# Patient Record
Sex: Female | Born: 1935 | Race: Black or African American | Hispanic: No | Marital: Single | Smoking: Never smoker
Health system: Southern US, Community
[De-identification: ages and names within clinical notes are randomized; demographics above are authoritative.]

## PROBLEM LIST (undated history)

## (undated) DIAGNOSIS — I10 Essential (primary) hypertension: Secondary | ICD-10-CM

## (undated) DIAGNOSIS — J45909 Unspecified asthma, uncomplicated: Secondary | ICD-10-CM

## (undated) DIAGNOSIS — I639 Cerebral infarction, unspecified: Secondary | ICD-10-CM

## (undated) DIAGNOSIS — I2510 Atherosclerotic heart disease of native coronary artery without angina pectoris: Secondary | ICD-10-CM

## (undated) DIAGNOSIS — E119 Type 2 diabetes mellitus without complications: Secondary | ICD-10-CM

## (undated) HISTORY — PX: BREAST SURGERY: SHX581

---

## 2013-03-09 DIAGNOSIS — I639 Cerebral infarction, unspecified: Secondary | ICD-10-CM

## 2013-03-09 HISTORY — DX: Cerebral infarction, unspecified: I63.9

## 2013-05-16 ENCOUNTER — Encounter (HOSPITAL_COMMUNITY): Payer: Self-pay | Admitting: Emergency Medicine

## 2013-05-16 ENCOUNTER — Emergency Department (HOSPITAL_COMMUNITY)
Admission: EM | Admit: 2013-05-16 | Discharge: 2013-05-17 | Disposition: A | Discharge status: Home / Self Care | Payer: Medicare (Managed Care) | Attending: Emergency Medicine | Admitting: Emergency Medicine

## 2013-05-16 DIAGNOSIS — Z79899 Other long term (current) drug therapy: Secondary | ICD-10-CM | POA: Insufficient documentation

## 2013-05-16 DIAGNOSIS — E119 Type 2 diabetes mellitus without complications: Secondary | ICD-10-CM | POA: Insufficient documentation

## 2013-05-16 DIAGNOSIS — Z791 Long term (current) use of non-steroidal anti-inflammatories (NSAID): Secondary | ICD-10-CM | POA: Insufficient documentation

## 2013-05-16 DIAGNOSIS — J45909 Unspecified asthma, uncomplicated: Secondary | ICD-10-CM | POA: Insufficient documentation

## 2013-05-16 DIAGNOSIS — I2510 Atherosclerotic heart disease of native coronary artery without angina pectoris: Secondary | ICD-10-CM | POA: Insufficient documentation

## 2013-05-16 DIAGNOSIS — Z8673 Personal history of transient ischemic attack (TIA), and cerebral infarction without residual deficits: Secondary | ICD-10-CM | POA: Insufficient documentation

## 2013-05-16 DIAGNOSIS — I10 Essential (primary) hypertension: Secondary | ICD-10-CM | POA: Insufficient documentation

## 2013-05-16 DIAGNOSIS — R319 Hematuria, unspecified: Secondary | ICD-10-CM

## 2013-05-16 DIAGNOSIS — Z794 Long term (current) use of insulin: Secondary | ICD-10-CM | POA: Insufficient documentation

## 2013-05-16 DIAGNOSIS — N390 Urinary tract infection, site not specified: Secondary | ICD-10-CM | POA: Insufficient documentation

## 2013-05-16 DIAGNOSIS — Z7902 Long term (current) use of antithrombotics/antiplatelets: Secondary | ICD-10-CM | POA: Insufficient documentation

## 2013-05-16 DIAGNOSIS — Z7982 Long term (current) use of aspirin: Secondary | ICD-10-CM | POA: Insufficient documentation

## 2013-05-16 HISTORY — DX: Essential (primary) hypertension: I10

## 2013-05-16 HISTORY — DX: Type 2 diabetes mellitus without complications: E11.9

## 2013-05-16 HISTORY — DX: Atherosclerotic heart disease of native coronary artery without angina pectoris: I25.10

## 2013-05-16 HISTORY — DX: Cerebral infarction, unspecified: I63.9

## 2013-05-16 HISTORY — DX: Unspecified asthma, uncomplicated: J45.909

## 2013-05-16 LAB — COMPREHENSIVE METABOLIC PANEL WITH GFR
ALT: 12 U/L (ref 0–35)
AST: 13 U/L (ref 0–37)
Albumin: 3.5 g/dL (ref 3.5–5.2)
Alkaline Phosphatase: 78 U/L (ref 39–117)
BUN: 18 mg/dL (ref 6–23)
CO2: 31 meq/L (ref 19–32)
Calcium: 9.4 mg/dL (ref 8.4–10.5)
Chloride: 103 meq/L (ref 96–112)
Creatinine, Ser: 0.85 mg/dL (ref 0.50–1.10)
GFR calc Af Amer: 74 mL/min — ABNORMAL LOW
GFR calc non Af Amer: 64 mL/min — ABNORMAL LOW
Glucose, Bld: 224 mg/dL — ABNORMAL HIGH (ref 70–99)
Potassium: 4.3 meq/L (ref 3.5–5.1)
Sodium: 140 meq/L (ref 135–145)
Total Bilirubin: 0.2 mg/dL — ABNORMAL LOW (ref 0.3–1.2)
Total Protein: 7.1 g/dL (ref 6.0–8.3)

## 2013-05-16 LAB — URINALYSIS W MICROSCOPIC + REFLEX CULTURE
Glucose, UA: 100 mg/dL — AB
Ketones, ur: 15 mg/dL — AB
Nitrite: POSITIVE — AB
Protein, ur: 100 mg/dL — AB
Specific Gravity, Urine: 1.02 (ref 1.005–1.030)
Urobilinogen, UA: 2 mg/dL — ABNORMAL HIGH (ref 0.0–1.0)
pH: 5.5 (ref 5.0–8.0)

## 2013-05-16 LAB — CBC WITH DIFFERENTIAL/PLATELET
Basophils Relative: 0 % (ref 0–1)
Eosinophils Absolute: 0.1 10*3/uL (ref 0.0–0.7)
HCT: 41 % (ref 36.0–46.0)
Hemoglobin: 13.7 g/dL (ref 12.0–15.0)
Lymphocytes Relative: 31 % (ref 12–46)
Lymphs Abs: 2.8 10*3/uL (ref 0.7–4.0)
MCH: 29.7 pg (ref 26.0–34.0)
MCHC: 33.4 g/dL (ref 30.0–36.0)
MCV: 88.7 fL (ref 78.0–100.0)
Monocytes Absolute: 0.6 10*3/uL (ref 0.1–1.0)
Monocytes Relative: 7 % (ref 3–12)
Neutrophils Relative %: 61 % (ref 43–77)
RBC: 4.62 MIL/uL (ref 3.87–5.11)
WBC: 9 10*3/uL (ref 4.0–10.5)

## 2013-05-16 LAB — PROTIME-INR
INR: 1.03 (ref 0.00–1.49)
Prothrombin Time: 13.3 seconds (ref 11.6–15.2)

## 2013-05-16 NOTE — ED Notes (Signed)
Pt states she had noticed small blood clots in urine and urine has blood tinge look. Pt denies any pain at this time.

## 2013-05-16 NOTE — ED Notes (Signed)
Md at bedside

## 2013-05-16 NOTE — ED Notes (Signed)
Pt is here with blood in her urine for last couple of days and now is constant.   Pt denies pain to abdomen but reports lower abdomen feels tight.  Pt is on plavix

## 2013-05-16 NOTE — ED Provider Notes (Signed)
CSN: [id number]     Arrival date & time 05/16/13  1842 History   First MD Initiated Contact with Patient 05/16/13 2047     Chief Complaint  Patient presents with  . Hematuria   (Consider location/radiation/quality/duration/timing/severity/associated sxs/prior Treatment) Patient is a 79 y.o. female presenting with hematuria. The history is provided by the patient and a caregiver.  Hematuria This is a new problem. Episode onset: 2-3 weeks ago. The problem occurs daily. The problem has been gradually worsening. Pertinent negatives include no chest pain, no abdominal pain, no headaches and no shortness of breath. Nothing aggravates the symptoms. Nothing relieves the symptoms. She has tried nothing for the symptoms. The treatment provided no relief.    Past Medical History  Diagnosis Date  . Hypertension   . Diabetes mellitus without complication   . Coronary artery disease   . Stroke   . Asthma    History reviewed. No pertinent past surgical history. No family history on file. History  Substance Use Topics  . Smoking status: Never Smoker   . Smokeless tobacco: Not on file  . Alcohol Use: No   OB History   Grav Para Term Preterm Abortions TAB SAB Ect Mult Living                 Review of Systems  Constitutional: Negative for fever and fatigue.  HENT: Negative for congestion and drooling.   Eyes: Negative for pain.  Respiratory: Negative for cough and shortness of breath.   Cardiovascular: Negative for chest pain.  Gastrointestinal: Negative for nausea, vomiting, abdominal pain and diarrhea.  Genitourinary: Positive for hematuria. Negative for dysuria.  Musculoskeletal: Negative for back pain, gait problem and neck pain.  Skin: Negative for color change.  Neurological: Negative for dizziness and headaches.  Hematological: Negative for adenopathy.  Psychiatric/Behavioral: Negative for behavioral problems.  All other systems reviewed and are negative.    Allergies  Review  of patient's allergies indicates no known allergies.  Home Medications   Current Outpatient Rx  Name  Route  Sig  Dispense  Refill  . amLODipine (NORVASC) 10 MG tablet   Oral   Take 10 mg by mouth daily.         Marland Kitchen aspirin EC 81 MG tablet   Oral   Take 81 mg by mouth daily.         Marland Kitchen atenolol (TENORMIN) 50 MG tablet   Oral   Take 50 mg by mouth daily.         . bimatoprost (LUMIGAN) 0.01 % SOLN               . brimonidine (ALPHAGAN) 0.2 % ophthalmic solution               . carvedilol (COREG) 12.5 MG tablet   Oral   Take 12.5 mg by mouth 2 (two) times daily with a meal.         . clopidogrel (PLAVIX) 75 MG tablet   Oral   Take 75 mg by mouth daily with breakfast.         . dexlansoprazole (DEXILANT) 60 MG capsule   Oral   Take 60 mg by mouth daily.         . hydrochlorothiazide (MICROZIDE) 12.5 MG capsule   Oral   Take 12.5 mg by mouth daily.         . insulin aspart (NOVOLOG) 100 UNIT/ML injection   Subcutaneous   Inject into the skin 3 (three) times  daily before meals.         . insulin detemir (LEVEMIR) 100 UNIT/ML injection   Subcutaneous   Inject into the skin at bedtime.         . insulin lispro protamine-lispro (HUMALOG MIX 75/25) (75-25) 100 UNIT/ML SUSP injection   Subcutaneous   Inject into the skin.         Marland Kitchen irbesartan (AVAPRO) 300 MG tablet   Oral   Take 300 mg by mouth daily.         . isosorbide mononitrate (IMDUR) 30 MG 24 hr tablet   Oral   Take 30 mg by mouth daily.         . meloxicam (MOBIC) 15 MG tablet   Oral   Take 15 mg by mouth daily.         . rosuvastatin (CRESTOR) 40 MG tablet   Oral   Take 40 mg by mouth daily.          BP 155/87  Pulse 86  Temp(Src) 97.6 F (36.4 C) (Oral)  Resp 16  Wt 205 lb (92.987 kg)  SpO2 97% Physical Exam  Nursing note and vitals reviewed. Constitutional: She is oriented to person, place, and time. She appears well-developed and well-nourished.  HENT:   Head: Normocephalic.  Mouth/Throat: Oropharynx is clear and moist. No oropharyngeal exudate.  Eyes: Conjunctivae and EOM are normal. Pupils are equal, round, and reactive to light.  Neck: Normal range of motion. Neck supple.  Cardiovascular: Normal rate, regular rhythm, normal heart sounds and intact distal pulses.  Exam reveals no gallop and no friction rub.   No murmur heard. Pulmonary/Chest: Effort normal and breath sounds normal. No respiratory distress. She has no wheezes.  Abdominal: Soft. Bowel sounds are normal. There is no tenderness. There is no rebound and no guarding.  Genitourinary: Vagina normal.  Normal-appearing rectum. Brown stool.  Normal appearing external vagina. Normal appearing cervix. No blood noted in vaginal canal.   Musculoskeletal: Normal range of motion. She exhibits no edema and no tenderness.  Neurological: She is alert and oriented to person, place, and time.  Skin: Skin is warm and dry.  Psychiatric: She has a normal mood and affect. Her behavior is normal.    ED Course  Procedures (including critical care time) Labs Review Labs Reviewed  COMPREHENSIVE METABOLIC PANEL - Abnormal; Notable for the following:    Glucose, Bld 224 (*)    Total Bilirubin 0.2 (*)    GFR calc non Af Amer 64 (*)    GFR calc Af Amer 74 (*)    All other components within normal limits  URINALYSIS W MICROSCOPIC + REFLEX CULTURE - Abnormal; Notable for the following:    Color, Urine AMBER (*)    APPearance CLOUDY (*)    Glucose, UA 100 (*)    Hgb urine dipstick LARGE (*)    Bilirubin Urine MODERATE (*)    Ketones, ur 15 (*)    Protein, ur 100 (*)    Urobilinogen, UA 2.0 (*)    Nitrite POSITIVE (*)    Leukocytes, UA TRACE (*)    Bacteria, UA FEW (*)    All other components within normal limits  URINE CULTURE  CBC WITH DIFFERENTIAL  PROTIME-INR  OCCULT BLOOD, POC DEVICE   Imaging Review No results found.  EKG Interpretation   None       MDM   1. Hematuria    2. UTI (lower urinary tract infection)    9:16 PM  79 y.o. female with a history of MI and stroke on baby aspirin and Plavix who presents with mild blood streaking in her diaper for the last 2-3 weeks. The family states that the patient was recently discharged from a hospital in [place] after being hospitalized for approximately 30 days for heart attack and stroke. They report that upon discharge she was started on Plavix in addition to her baby aspirin. Since starting this medication they have seen some mild blood streaking in her depends. She denies any blood in her stools. She denies any pain as well. She is afebrile and vital signs are unremarkable here. Will get screening labwork, rectal exam, pelvic exam.   Normal pelvic. Hemeoccult neg. UA shows hematuria and evidence of a UTI. Will tx w/ vantin. I am hesitant to d/c her plavix at this time as I am not able to review her medical records from [place] and she has normal Hgb, no sx of anemia, no abnormal VS, or evidence of life threatening bleed. I will recommend that the family touch base w/ her physicians in [place] tomorrow to d/c possible d/c of this medicine. As the family is here visiting they requested Rx's for some of her medicines until they get back to [place].  I have discussed the diagnosis/risks/treatment options with the patient/family and believe the pt to be eligible for discharge home to follow-up with pcp for further evaluation once they return to [place]. We also discussed returning to the ED immediately if new or worsening sx occur. We discussed the sx which are most concerning (e.g., abd pain, fever, vomiting, worsening hematuria, sx of anemia) that necessitate immediate return. Any new prescriptions provided to the patient are listed below.  Discharge Medication List as of 05/17/2013 12:16 AM    START taking these medications   Details  !! amLODipine (NORVASC) 10 MG tablet Take 1 tablet (10 mg total) by mouth daily., Starting 05/17/2013, Until  Discontinued, Print    !! carvedilol (COREG) 12.5 MG tablet Take 1 tablet (12.5 mg total) by mouth 2 (two) times daily with a meal., Starting 05/17/2013, Until Discontinued, Print    cefpodoxime (VANTIN) 200 MG tablet Take 1 tablet (200 mg total) by mouth 2 (two) times daily., Starting 05/17/2013, Until Discontinued, Print    !! irbesartan (AVAPRO) 300 MG tablet Take 1 tablet (300 mg total) by mouth daily., Starting 05/17/2013, Until Discontinued, Print     !! - Potential duplicate medications found. Please discuss with provider.         Junius Argyle, MD 05/17/13 [phone]

## 2013-05-17 DIAGNOSIS — R319 Hematuria, unspecified: Secondary | ICD-10-CM | POA: Diagnosis present

## 2013-05-17 DIAGNOSIS — N390 Urinary tract infection, site not specified: Secondary | ICD-10-CM | POA: Diagnosis present

## 2013-05-17 MED ORDER — CARVEDILOL 12.5 MG PO TABS: 12.5000 mg | ORAL_TABLET | Freq: Two times a day (BID) | ORAL | Status: AC

## 2013-05-17 MED ORDER — AMLODIPINE BESYLATE 10 MG PO TABS: 10.0000 mg | ORAL_TABLET | Freq: Every day | ORAL | Status: AC

## 2013-05-17 MED ORDER — CEFPODOXIME PROXETIL 200 MG PO TABS: 200.0000 mg | ORAL_TABLET | Freq: Two times a day (BID) | ORAL | Status: DC

## 2013-05-17 MED ORDER — IRBESARTAN 300 MG PO TABS: 300.0000 mg | ORAL_TABLET | Freq: Every day | ORAL | Status: AC

## 2013-05-17 MED ORDER — CEFPODOXIME PROXETIL 200 MG PO TABS
200.0000 mg | ORAL_TABLET | Freq: Once | ORAL | Status: AC
  Administered 2013-05-17: 200 mg via ORAL
  Filled 2013-05-17: qty 1

## 2013-05-18 LAB — URINE CULTURE: Colony Count: >=100000

## 2013-06-05 ENCOUNTER — Encounter (HOSPITAL_COMMUNITY): Payer: Self-pay | Admitting: Emergency Medicine

## 2013-06-05 ENCOUNTER — Emergency Department (HOSPITAL_COMMUNITY)
Admission: EM | Admit: 2013-06-05 | Discharge: 2013-06-05 | Disposition: A | Discharge status: Home / Self Care | Payer: Medicare (Managed Care) | Attending: Emergency Medicine | Admitting: Emergency Medicine

## 2013-06-05 ENCOUNTER — Emergency Department (HOSPITAL_COMMUNITY): Payer: Medicare (Managed Care)

## 2013-06-05 DIAGNOSIS — J45909 Unspecified asthma, uncomplicated: Secondary | ICD-10-CM | POA: Insufficient documentation

## 2013-06-05 DIAGNOSIS — Z7902 Long term (current) use of antithrombotics/antiplatelets: Secondary | ICD-10-CM | POA: Insufficient documentation

## 2013-06-05 DIAGNOSIS — I2510 Atherosclerotic heart disease of native coronary artery without angina pectoris: Secondary | ICD-10-CM | POA: Insufficient documentation

## 2013-06-05 DIAGNOSIS — Z7982 Long term (current) use of aspirin: Secondary | ICD-10-CM | POA: Insufficient documentation

## 2013-06-05 DIAGNOSIS — Z794 Long term (current) use of insulin: Secondary | ICD-10-CM | POA: Insufficient documentation

## 2013-06-05 DIAGNOSIS — Z8673 Personal history of transient ischemic attack (TIA), and cerebral infarction without residual deficits: Secondary | ICD-10-CM | POA: Insufficient documentation

## 2013-06-05 DIAGNOSIS — R42 Dizziness and giddiness: Secondary | ICD-10-CM | POA: Insufficient documentation

## 2013-06-05 DIAGNOSIS — I10 Essential (primary) hypertension: Secondary | ICD-10-CM | POA: Insufficient documentation

## 2013-06-05 DIAGNOSIS — E119 Type 2 diabetes mellitus without complications: Secondary | ICD-10-CM | POA: Insufficient documentation

## 2013-06-05 DIAGNOSIS — Z79899 Other long term (current) drug therapy: Secondary | ICD-10-CM | POA: Insufficient documentation

## 2013-06-05 LAB — CBC WITH DIFFERENTIAL/PLATELET
Basophils Relative: 0 % (ref 0–1)
Eosinophils Absolute: 0.1 10*3/uL (ref 0.0–0.7)
Eosinophils Relative: 1 % (ref 0–5)
HCT: 41.8 % (ref 36.0–46.0)
Hemoglobin: 13.8 g/dL (ref 12.0–15.0)
MCH: 29.4 pg (ref 26.0–34.0)
MCHC: 33 g/dL (ref 30.0–36.0)
MCV: 88.9 fL (ref 78.0–100.0)
Monocytes Absolute: 0.6 10*3/uL (ref 0.1–1.0)
Monocytes Relative: 5 % (ref 3–12)
Neutro Abs: 10.2 10*3/uL — ABNORMAL HIGH (ref 1.7–7.7)
Neutrophils Relative %: 82 % — ABNORMAL HIGH (ref 43–77)
RBC: 4.7 MIL/uL (ref 3.87–5.11)

## 2013-06-05 LAB — BASIC METABOLIC PANEL
BUN: 17 mg/dL (ref 6–23)
CO2: 30 mEq/L (ref 19–32)
Chloride: 102 mEq/L (ref 96–112)
Creatinine, Ser: 0.71 mg/dL (ref 0.50–1.10)
GFR calc Af Amer: >90 mL/min (ref >90)
GFR calc non Af Amer: 80 mL/min — ABNORMAL LOW (ref >90)
Glucose, Bld: 151 mg/dL — ABNORMAL HIGH (ref 70–99)
Potassium: 3.9 mEq/L (ref 3.5–5.1)
Sodium: 139 mEq/L (ref 135–145)

## 2013-06-05 MED ORDER — MECLIZINE HCL 25 MG PO TABS
25.0000 mg | ORAL_TABLET | Freq: Once | ORAL | Status: AC
  Administered 2013-06-05: 25 mg via ORAL
  Filled 2013-06-05: qty 1

## 2013-06-05 MED ORDER — MECLIZINE HCL 25 MG PO TABS: 25.0000 mg | ORAL_TABLET | Freq: Three times a day (TID) | ORAL | Status: DC | PRN

## 2013-06-05 MED ORDER — ROSUVASTATIN CALCIUM 40 MG PO TABS: 40.0000 mg | ORAL_TABLET | Freq: Every day | ORAL | Status: AC

## 2013-06-05 NOTE — ED Notes (Signed)
Pt c/o dizziness upon waking this am with room spinning and worse with position change; pt c/o hypertension prior to arrival

## 2013-06-05 NOTE — ED Provider Notes (Signed)
CSN: [id number]     Arrival date & time 06/05/13  1120 History   First MD Initiated Contact with Patient 06/05/13 1139     Chief Complaint  Patient presents with  . Dizziness  . Hypertension   (Consider location/radiation/quality/duration/timing/severity/associated sxs/prior Treatment) HPI Comments: Patient is a 79 year old female with history of CAD and stroke. She presents today with complaints of dizziness that started this morning shortly after waking from sleep. She states she attempted to get out of bed and became very dizzy then fell back to the bed. She denies any syncopal episode or loss of consciousness. She describes her dizziness as a spinning sensation that is worse with turning head and change in position. She denies any chest pain or any palpitations. She denies any headache or weakness or numbness in her arms or legs. She tells me that she had a stroke in October that left her with mild right arm weakness.  Patient is a 79 y.o. female presenting with dizziness. The history is provided by the patient.  Dizziness Quality:  Head spinning and imbalance Severity:  Moderate Onset quality:  Sudden Duration:  5 hours Timing:  Constant Progression:  Unchanged Chronicity:  New Context: bending over and head movement   Context: not with loss of consciousness   Relieved by:  Nothing Worsened by:  Movement, turning head and standing up Ineffective treatments:  None tried Risk factors: hx of stroke     Past Medical History  Diagnosis Date  . Hypertension   . Diabetes mellitus without complication   . Coronary artery disease   . Stroke   . Asthma    History reviewed. No pertinent past surgical history. History reviewed. No pertinent family history. History  Substance Use Topics  . Smoking status: Never Smoker   . Smokeless tobacco: Not on file  . Alcohol Use: No   OB History   Grav Para Term Preterm Abortions TAB SAB Ect Mult Living                 Review of Systems   Neurological: Positive for dizziness.  All other systems reviewed and are negative.    Allergies  Review of patient's allergies indicates no known allergies.  Home Medications   Current Outpatient Rx  Name  Route  Sig  Dispense  Refill  . albuterol (PROVENTIL HFA;VENTOLIN HFA) 108 (90 BASE) MCG/ACT inhaler   Inhalation   Inhale 1-2 puffs into the lungs every 6 (six) hours as needed for wheezing or shortness of breath.         Marland Kitchen amLODipine (NORVASC) 10 MG tablet   Oral   Take 10 mg by mouth daily.         Marland Kitchen amLODipine (NORVASC) 10 MG tablet   Oral   Take 1 tablet (10 mg total) by mouth daily.   10 tablet   0   . aspirin EC 81 MG tablet   Oral   Take 81 mg by mouth daily.         . brimonidine (ALPHAGAN) 0.2 % ophthalmic solution   Both Eyes   Place 1 drop into both eyes 2 (two) times daily.          . carvedilol (COREG) 12.5 MG tablet   Oral   Take 12.5 mg by mouth 2 (two) times daily with a meal.         . carvedilol (COREG) 12.5 MG tablet   Oral   Take 1 tablet (12.5 mg total) by  mouth 2 (two) times daily with a meal.   20 tablet   0   . cefpodoxime (VANTIN) 200 MG tablet   Oral   Take 1 tablet (200 mg total) by mouth 2 (two) times daily.   14 tablet   0   . clopidogrel (PLAVIX) 75 MG tablet   Oral   Take 75 mg by mouth daily with breakfast.         . insulin aspart (NOVOLOG) 100 UNIT/ML injection   Subcutaneous   Inject 3 Units into the skin 3 (three) times daily before meals.          . insulin detemir (LEVEMIR) 100 UNIT/ML injection   Subcutaneous   Inject 28 Units into the skin at bedtime.          . irbesartan (AVAPRO) 300 MG tablet   Oral   Take 300 mg by mouth daily.         . irbesartan (AVAPRO) 300 MG tablet   Oral   Take 1 tablet (300 mg total) by mouth daily.   10 tablet   0   . latanoprost (XALATAN) 0.005 % ophthalmic solution   Both Eyes   Place 1 drop into both eyes at bedtime.         . rosuvastatin  (CRESTOR) 40 MG tablet   Oral   Take 40 mg by mouth at bedtime.           BP 150/73  Pulse 69  Temp(Src) 98.2 F (36.8 C) (Oral)  Resp 13  SpO2 98% Physical Exam  Nursing note and vitals reviewed. Constitutional: She is oriented to person, place, and time. She appears well-developed and well-nourished. No distress.  HENT:  Head: Normocephalic and atraumatic.  Eyes: EOM are normal. Pupils are equal, round, and reactive to light.  Neck: Normal range of motion. Neck supple.  Cardiovascular: Normal rate and regular rhythm.  Exam reveals no gallop and no friction rub.   No murmur heard. Pulmonary/Chest: Effort normal and breath sounds normal. No respiratory distress. She has no wheezes.  Abdominal: Soft. Bowel sounds are normal. She exhibits no distension. There is no tenderness.  Musculoskeletal: Normal range of motion.  Neurological: She is alert and oriented to person, place, and time. No cranial nerve deficit. She exhibits normal muscle tone. Coordination normal.  Skin: Skin is warm and dry. She is not diaphoretic.    ED Course  Procedures (including critical care time) Labs Review Labs Reviewed - No data to display Imaging Review No results found.  EKG Interpretation    Date/Time:  Sunday June 05 2013 11:48:04 EST Ventricular Rate:  81 PR Interval:  238 QRS Duration: 84 QT Interval:  374 QTC Calculation: 434 R Axis:   -64 Text Interpretation:  Sinus rhythm Prolonged PR interval Left anterior fascicular block Consider anterior infarct Confirmed by DELOS  MD, Rakel Junio ([id number]) on 06/05/2013 11:58:37 AM            MDM  No diagnosis found. Patient is a 79 year old female with history of hypertension and stroke. She presents today with complaints of dizziness that sounds vertiginous. It is worse with position and with head movement. Workup reveals no evidence of new stroke or acute laboratory abnormality. She is feeling better with meclizine and I feel as though  he is stable for discharge. She will be prescribed meclizine and advised to followup if she develops any new or concerning symptoms and followup with her Dr. if not improving in the next  several days.    Geoffery Lyons, MD 06/05/13 [phone]

## 2015-03-16 ENCOUNTER — Encounter (HOSPITAL_BASED_OUTPATIENT_CLINIC_OR_DEPARTMENT_OTHER): Payer: Self-pay | Admitting: Emergency Medicine

## 2015-03-16 ENCOUNTER — Emergency Department (HOSPITAL_BASED_OUTPATIENT_CLINIC_OR_DEPARTMENT_OTHER)
Admission: EM | Admit: 2015-03-16 | Discharge: 2015-03-16 | Disposition: A | Discharge status: Home / Self Care | Payer: Medicare (Managed Care) | Attending: Emergency Medicine | Admitting: Emergency Medicine

## 2015-03-16 DIAGNOSIS — Z794 Long term (current) use of insulin: Secondary | ICD-10-CM | POA: Diagnosis not present

## 2015-03-16 DIAGNOSIS — Z7902 Long term (current) use of antithrombotics/antiplatelets: Secondary | ICD-10-CM | POA: Diagnosis not present

## 2015-03-16 DIAGNOSIS — E119 Type 2 diabetes mellitus without complications: Secondary | ICD-10-CM | POA: Insufficient documentation

## 2015-03-16 DIAGNOSIS — Z792 Long term (current) use of antibiotics: Secondary | ICD-10-CM | POA: Diagnosis not present

## 2015-03-16 DIAGNOSIS — Z8673 Personal history of transient ischemic attack (TIA), and cerebral infarction without residual deficits: Secondary | ICD-10-CM | POA: Insufficient documentation

## 2015-03-16 DIAGNOSIS — I2510 Atherosclerotic heart disease of native coronary artery without angina pectoris: Secondary | ICD-10-CM | POA: Insufficient documentation

## 2015-03-16 DIAGNOSIS — Z7982 Long term (current) use of aspirin: Secondary | ICD-10-CM | POA: Diagnosis not present

## 2015-03-16 DIAGNOSIS — Z79899 Other long term (current) drug therapy: Secondary | ICD-10-CM | POA: Diagnosis not present

## 2015-03-16 DIAGNOSIS — J45909 Unspecified asthma, uncomplicated: Secondary | ICD-10-CM | POA: Insufficient documentation

## 2015-03-16 DIAGNOSIS — H9201 Otalgia, right ear: Secondary | ICD-10-CM | POA: Diagnosis present

## 2015-03-16 DIAGNOSIS — H6121 Impacted cerumen, right ear: Secondary | ICD-10-CM | POA: Diagnosis not present

## 2015-03-16 DIAGNOSIS — I10 Essential (primary) hypertension: Secondary | ICD-10-CM | POA: Insufficient documentation

## 2015-03-16 MED ORDER — EAR WAX DROPS 6.5 % OT SOLN: 4.0000 [drp] | Freq: Two times a day (BID) | OTIC | Status: DC

## 2015-03-16 NOTE — ED Notes (Signed)
MD aware of the need for further attempts with wax

## 2015-03-16 NOTE — Discharge Instructions (Signed)
Ear Drops, Adult You have been diagnosed with a condition requiring you to put drops of medicine into your outer ear. HOME CARE INSTRUCTIONS   Put drops in the affected ear as instructed. After putting the drops in, you will need to lie down with the affected ear facing up for ten minutes so the drops will remain in the ear canal and run down and fill the canal. Continue using the ear drops for as long as directed by your health care provider.  Prior to getting up, put a cotton ball gently in your ear canal. Leave enough of the cotton ball out so it can be easily removed. Do not attempt to push this down into the canal with a cotton-tipped swab or other instrument.  Do not irrigate or wash out your ears if you have had a perforated eardrum or mastoid surgery, or unless instructed to do so by your health care provider.  Keep appointments with your health care provider as instructed.  Finish all medicine, or use for the length of time prescribed by your health care provider. Continue the drops even if your problem seems to be doing well after a couple days, or continue as instructed. SEEK MEDICAL CARE IF:  You become worse or develop increasing pain.  You notice any unusual drainage from your ear (particularly if the drainage has a bad smell).  You develop hearing difficulties.  You experience a serious form of dizziness in which you feel as if the room is spinning, and you feel nauseated (vertigo).  The outside of your ear becomes red or swollen or both. This may be a sign of an allergic reaction. MAKE SURE YOU:   Understand these instructions.  Will watch your condition.  Will get help right away if you are not doing well or get worse.   This information is not intended to replace advice given to you by your health care provider. Make sure you discuss any questions you have with your health care provider.   Document Released: 05/20/2001 Document Revised: 06/16/2014 Document  Reviewed: 12/21/2012 Elsevier Interactive Patient Education 2016 [employer]  Cerumen Impaction The structures of the external ear canal secrete a waxy substance known as cerumen. Excess cerumen can build up in the ear canal, causing a condition known as cerumen impaction. Cerumen impaction can cause ear pain and disrupt the function of the ear. The rate of cerumen production differs for each individual. In certain individuals, the configuration of the ear canal may decrease his or her ability to naturally remove cerumen. CAUSES Cerumen impaction is caused by excessive cerumen production or buildup. RISK FACTORS  Frequent use of swabs to clean ears.  Having narrow ear canals.  Having eczema.  Being dehydrated. SIGNS AND SYMPTOMS  Diminished hearing.  Ear drainage.  Ear pain.  Ear itch. TREATMENT Treatment may involve:  Over-the-counter or prescription ear drops to soften the cerumen.  Removal of cerumen by a health care provider. This may be done with:  Irrigation with warm water. This is the most common method of removal.  Ear curettes and other instruments.  Surgery. This may be done in severe cases. HOME CARE INSTRUCTIONS  Take medicines only as directed by your health care provider.  Do not insert objects into the ear with the intent of cleaning the ear. PREVENTION  Do not insert objects into the ear, even with the intent of cleaning the ear. Removing cerumen as a part of normal hygiene is not necessary, and the use of  swabs in the ear canal is not recommended.  Drink enough water to keep your urine clear or pale yellow.  Control your eczema if you have it. SEEK MEDICAL CARE IF:  You develop ear pain.  You develop bleeding from the ear.  The cerumen does not clear after you use ear drops as directed.   This information is not intended to replace advice given to you by your health care provider. Make sure you discuss any questions you have with your  health care provider.   Document Released: 07/03/2004 Document Revised: 06/16/2014 Document Reviewed: 01/10/2015 Elsevier Interactive Patient Education [employer]

## 2015-03-16 NOTE — ED Provider Notes (Signed)
CSN: [id number]     Arrival date & time 03/16/15  1926 History  By signing my name below, I, Lyndel Safe, attest that this documentation has been prepared under the direction and in the presence of [name], MD. Electronically Signed: Lyndel Safe, ED Scribe. 03/16/2015. 7:53 PM.   Chief Complaint  Patient presents with  . Otalgia   The history is provided by the patient. No language interpreter was used.   HPI Comments: Marthe Dant is a 81 y.o. female, with a PMhx of HTN, DM, CAD, and CVA, who presents to the Emergency Department complaining of right-sided otalgia with a buzzing sensation in right ear and tinnitus onset 4 days ago that progressively worsened last night. Pt associates decreased hearing in right ear. She reports inserting her finger into her right ear during buzzing sensation alleviates the sensation during the act. Pt notes mild rhinorrhea but denies any other URI symptoms. Also denies ear drainage, fevers or left-sided otalgia. Pt does not wear hearing aids. No neck pain or pain behind her right ear.   Past Medical History  Diagnosis Date  . Hypertension   . Diabetes mellitus without complication (HCC)   . Coronary artery disease   . Stroke (HCC)   . Asthma    History reviewed. No pertinent past surgical history. History reviewed. No pertinent family history. Social History  Substance Use Topics  . Smoking status: Never Smoker   . Smokeless tobacco: None  . Alcohol Use: No   OB History    No data available     Review of Systems  Constitutional: Negative for fever.  HENT: Positive for ear pain, hearing loss and rhinorrhea. Negative for congestion, ear discharge, sneezing and sore throat.   Respiratory: Negative for cough.   Musculoskeletal: Negative for neck pain.  10 Systems reviewed and all are negative for acute change except as noted in the HPI. Allergies  Review of patient's allergies indicates no known allergies.  Home Medications    Prior to Admission medications   Medication Sig Start Date End Date Taking? Authorizing Provider  albuterol (PROVENTIL HFA;VENTOLIN HFA) 108 (90 BASE) MCG/ACT inhaler Inhale 1-2 puffs into the lungs every 6 (six) hours as needed for wheezing or shortness of breath.    Historical Provider, MD  amLODipine (NORVASC) 10 MG tablet Take 10 mg by mouth daily.    Historical Provider, MD  amLODipine (NORVASC) 10 MG tablet Take 1 tablet (10 mg total) by mouth daily. 05/17/13   [name], MD  aspirin EC 81 MG tablet Take 81 mg by mouth daily.    Historical Provider, MD  brimonidine (ALPHAGAN) 0.2 % ophthalmic solution Place 1 drop into both eyes 2 (two) times daily.     Historical Provider, MD  carvedilol (COREG) 12.5 MG tablet Take 12.5 mg by mouth 2 (two) times daily with a meal.    Historical Provider, MD  carvedilol (COREG) 12.5 MG tablet Take 1 tablet (12.5 mg total) by mouth 2 (two) times daily with a meal. 05/17/13   [name], MD  cefpodoxime (VANTIN) 200 MG tablet Take 1 tablet (200 mg total) by mouth 2 (two) times daily. 05/17/13   [name], MD  clopidogrel (PLAVIX) 75 MG tablet Take 75 mg by mouth daily with breakfast.    Historical Provider, MD  insulin aspart (NOVOLOG) 100 UNIT/ML injection Inject 3 Units into the skin 3 (three) times daily before meals.     Historical Provider, MD  insulin detemir (LEVEMIR) 100 UNIT/ML injection Inject  28 Units into the skin at bedtime.     Historical Provider, MD  irbesartan (AVAPRO) 300 MG tablet Take 300 mg by mouth daily.    Historical Provider, MD  irbesartan (AVAPRO) 300 MG tablet Take 1 tablet (300 mg total) by mouth daily. 05/17/13   [name], MD  latanoprost (XALATAN) 0.005 % ophthalmic solution Place 1 drop into both eyes at bedtime.    Historical Provider, MD  meclizine (ANTIVERT) 25 MG tablet Take 1 tablet (25 mg total) by mouth 3 (three) times daily as needed for dizziness. 06/05/13   Geoffery Lyons, MD  rosuvastatin  (CRESTOR) 40 MG tablet Take 40 mg by mouth at bedtime.     Historical Provider, MD  rosuvastatin (CRESTOR) 40 MG tablet Take 1 tablet (40 mg total) by mouth daily. 06/05/13   Geoffery Lyons, MD   BP 193/70 mmHg  Pulse 92  Temp(Src) 98.2 F (36.8 C) (Oral)  Resp 16  Ht  (1.549 m)  Wt 149 lb (67.586 kg)  BMI 28.17 kg/m2  SpO2 99% Physical Exam  Constitutional: She is oriented to person, place, and time. She appears well-developed and well-nourished. No distress.  HENT:  Head: Normocephalic.  Left Ear: Tympanic membrane and ear canal normal.  Right ear; cerumen impaction.   Eyes: Conjunctivae are normal.  Neck: Normal range of motion. Neck supple.  Cardiovascular: Normal rate.   Pulmonary/Chest: Effort normal. No respiratory distress.  Musculoskeletal: Normal range of motion.  Lymphadenopathy:    She has no cervical adenopathy.  Neurological: She is alert and oriented to person, place, and time. Coordination normal.  Skin: Skin is warm.  Psychiatric: She has a normal mood and affect. Her behavior is normal.  Nursing note and vitals reviewed.   ED Course  Procedures  DIAGNOSTIC STUDIES: Oxygen Saturation is 99% on RA, normal by my interpretation.    COORDINATION OF CARE: 7:47 PM Discussed treatment plan with pt at bedside and pt agreed to plan.   MDM   Final diagnoses:  Cerumen impaction, right   patient here with a complaint of pain and ringing in the right ear. Evidence of cerumen impaction on exam. She has no fevers, mastoid tenderness, cervical adenopathy or other acute findings. Patient's ear was flushed repeatedly and also a curette used to remove a large amount of wax however she still has deep hard wax present. At this time the ear canal is irritated with some bleeding. Chose to stop trying to remove further wax and patient given ear wax drops as well as flushing apparatus. She will follow-up with her doctor if symptoms continue. At this time low suspicion or ear  infection and low suspicion for TM rupture however concern if we continue we may rupture her TM  I personally performed the services described in this documentation, which was scribed in my presence.  The recorded information has been reviewed and considered.   [name], MD 03/16/15 2231

## 2015-03-16 NOTE — ED Notes (Signed)
Patient states that she has ear pain to the right ear  - states that it is worse at night, feels like there is a fullness and sometimes a buzz in her ear

## 2018-05-31 ENCOUNTER — Inpatient Hospital Stay (HOSPITAL_BASED_OUTPATIENT_CLINIC_OR_DEPARTMENT_OTHER): Payer: Medicare (Managed Care)

## 2018-05-31 ENCOUNTER — Inpatient Hospital Stay (HOSPITAL_COMMUNITY): Payer: Medicare (Managed Care) | Admitting: Certified Registered"

## 2018-05-31 ENCOUNTER — Other Ambulatory Visit: Payer: Self-pay

## 2018-05-31 ENCOUNTER — Emergency Department (HOSPITAL_BASED_OUTPATIENT_CLINIC_OR_DEPARTMENT_OTHER): Payer: Medicare (Managed Care)

## 2018-05-31 ENCOUNTER — Encounter (HOSPITAL_BASED_OUTPATIENT_CLINIC_OR_DEPARTMENT_OTHER): Payer: Self-pay | Admitting: *Deleted

## 2018-05-31 ENCOUNTER — Inpatient Hospital Stay (HOSPITAL_BASED_OUTPATIENT_CLINIC_OR_DEPARTMENT_OTHER)
Admission: EM | Admit: 2018-05-31 | Discharge: 2018-06-09 | DRG: 871 | Disposition: E | Payer: Medicare (Managed Care) | Attending: Internal Medicine | Admitting: Internal Medicine

## 2018-05-31 ENCOUNTER — Inpatient Hospital Stay (HOSPITAL_COMMUNITY): Payer: Medicare (Managed Care)

## 2018-05-31 DIAGNOSIS — Z8673 Personal history of transient ischemic attack (TIA), and cerebral infarction without residual deficits: Secondary | ICD-10-CM | POA: Diagnosis not present

## 2018-05-31 DIAGNOSIS — I2510 Atherosclerotic heart disease of native coronary artery without angina pectoris: Secondary | ICD-10-CM | POA: Diagnosis present

## 2018-05-31 DIAGNOSIS — J9601 Acute respiratory failure with hypoxia: Secondary | ICD-10-CM | POA: Diagnosis present

## 2018-05-31 DIAGNOSIS — E872 Acidosis: Secondary | ICD-10-CM | POA: Diagnosis present

## 2018-05-31 DIAGNOSIS — I447 Left bundle-branch block, unspecified: Secondary | ICD-10-CM | POA: Diagnosis present

## 2018-05-31 DIAGNOSIS — J45901 Unspecified asthma with (acute) exacerbation: Secondary | ICD-10-CM | POA: Diagnosis present

## 2018-05-31 DIAGNOSIS — J810 Acute pulmonary edema: Secondary | ICD-10-CM | POA: Diagnosis present

## 2018-05-31 DIAGNOSIS — E1165 Type 2 diabetes mellitus with hyperglycemia: Secondary | ICD-10-CM

## 2018-05-31 DIAGNOSIS — A419 Sepsis, unspecified organism: Principal | ICD-10-CM | POA: Diagnosis present

## 2018-05-31 DIAGNOSIS — Z7902 Long term (current) use of antithrombotics/antiplatelets: Secondary | ICD-10-CM

## 2018-05-31 DIAGNOSIS — R7989 Other specified abnormal findings of blood chemistry: Secondary | ICD-10-CM | POA: Diagnosis present

## 2018-05-31 DIAGNOSIS — I469 Cardiac arrest, cause unspecified: Secondary | ICD-10-CM

## 2018-05-31 DIAGNOSIS — Z7982 Long term (current) use of aspirin: Secondary | ICD-10-CM

## 2018-05-31 DIAGNOSIS — R778 Other specified abnormalities of plasma proteins: Secondary | ICD-10-CM

## 2018-05-31 DIAGNOSIS — Z79899 Other long term (current) drug therapy: Secondary | ICD-10-CM | POA: Diagnosis not present

## 2018-05-31 DIAGNOSIS — R739 Hyperglycemia, unspecified: Secondary | ICD-10-CM

## 2018-05-31 DIAGNOSIS — I214 Non-ST elevation (NSTEMI) myocardial infarction: Secondary | ICD-10-CM

## 2018-05-31 DIAGNOSIS — IMO0001 Reserved for inherently not codable concepts without codable children: Secondary | ICD-10-CM | POA: Diagnosis present

## 2018-05-31 DIAGNOSIS — J189 Pneumonia, unspecified organism: Secondary | ICD-10-CM | POA: Diagnosis present

## 2018-05-31 DIAGNOSIS — Z794 Long term (current) use of insulin: Secondary | ICD-10-CM

## 2018-05-31 DIAGNOSIS — I10 Essential (primary) hypertension: Secondary | ICD-10-CM | POA: Diagnosis present

## 2018-05-31 DIAGNOSIS — R0602 Shortness of breath: Secondary | ICD-10-CM

## 2018-05-31 DIAGNOSIS — I252 Old myocardial infarction: Secondary | ICD-10-CM | POA: Diagnosis not present

## 2018-05-31 DIAGNOSIS — R0902 Hypoxemia: Secondary | ICD-10-CM

## 2018-05-31 LAB — CBC WITH DIFFERENTIAL/PLATELET
Abs Immature Granulocytes: 0.08 10*3/uL — ABNORMAL HIGH (ref 0.00–0.07)
Basophils Absolute: 0.1 10*3/uL (ref 0.0–0.1)
Basophils Relative: 0 %
EOS PCT: 0 %
Eosinophils Absolute: 0 10*3/uL (ref 0.0–0.5)
HCT: 45.2 % (ref 36.0–46.0)
HEMOGLOBIN: 13.6 g/dL (ref 12.0–15.0)
Immature Granulocytes: 1 %
Lymphocytes Relative: 14 %
Lymphs Abs: 2.4 10*3/uL (ref 0.7–4.0)
MCH: 28.9 pg (ref 26.0–34.0)
MCHC: 30.1 g/dL (ref 30.0–36.0)
MCV: 96 fL (ref 80.0–100.0)
Monocytes Absolute: 0.6 10*3/uL (ref 0.1–1.0)
Monocytes Relative: 4 %
NRBC: 0 % (ref 0.0–0.2)
Neutro Abs: 13.8 10*3/uL — ABNORMAL HIGH (ref 1.7–7.7)
Neutrophils Relative %: 81 %
Platelets: 204 10*3/uL (ref 150–400)
RBC: 4.71 MIL/uL (ref 3.87–5.11)
RDW: 13.1 % (ref 11.5–15.5)
WBC: 17 10*3/uL — ABNORMAL HIGH (ref 4.0–10.5)

## 2018-05-31 LAB — I-STAT CG4 LACTIC ACID, ED
LACTIC ACID, VENOUS: 2.68 mmol/L — AB (ref 0.5–1.9)
Lactic Acid, Venous: 2.06 mmol/L (ref 0.5–1.9)

## 2018-05-31 LAB — CBG MONITORING, ED
GLUCOSE-CAPILLARY: 402 mg/dL — AB (ref 70–99)
Glucose-Capillary: 412 mg/dL — ABNORMAL HIGH (ref 70–99)
Glucose-Capillary: 394 mg/dL — ABNORMAL HIGH (ref 70–99)
Glucose-Capillary: 358 mg/dL — ABNORMAL HIGH (ref 70–99)

## 2018-05-31 LAB — HEMOGLOBIN A1C
HEMOGLOBIN A1C: 8.1 % — AB (ref 4.8–5.6)
Mean Plasma Glucose: 185.77 mg/dL

## 2018-05-31 LAB — INFLUENZA PANEL BY PCR (TYPE A & B)
Influenza A By PCR: NEGATIVE
Influenza B By PCR: NEGATIVE

## 2018-05-31 LAB — BASIC METABOLIC PANEL
ANION GAP: 12 (ref 5–15)
BUN: 28 mg/dL — ABNORMAL HIGH (ref 8–23)
CALCIUM: 9.6 mg/dL (ref 8.9–10.3)
CO2: 25 mmol/L (ref 22–32)
Chloride: 101 mmol/L (ref 98–111)
Creatinine, Ser: 1.05 mg/dL — ABNORMAL HIGH (ref 0.44–1.00)
GFR calc Af Amer: 57 mL/min — ABNORMAL LOW (ref >60)
GFR calc non Af Amer: 49 mL/min — ABNORMAL LOW (ref >60)
Glucose, Bld: 544 mg/dL (ref 70–99)
Potassium: 4.1 mmol/L (ref 3.5–5.1)
Sodium: 138 mmol/L (ref 135–145)

## 2018-05-31 LAB — I-STAT ARTERIAL BLOOD GAS, ED
Acid-Base Excess: 3 mmol/L — ABNORMAL HIGH (ref 0.0–2.0)
BICARBONATE: 28.7 mmol/L — AB (ref 20.0–28.0)
O2 Saturation: 91 %
TCO2: 30 mmol/L (ref 22–32)
pCO2 arterial: 49.1 mmHg — ABNORMAL HIGH (ref 32.0–48.0)
pH, Arterial: 7.374 (ref 7.350–7.450)
pO2, Arterial: 64 mmHg — ABNORMAL LOW (ref 83.0–108.0)

## 2018-05-31 LAB — GLUCOSE, CAPILLARY
Glucose-Capillary: 296 mg/dL — ABNORMAL HIGH (ref 70–99)
Glucose-Capillary: 231 mg/dL — ABNORMAL HIGH (ref 70–99)
Glucose-Capillary: 215 mg/dL — ABNORMAL HIGH (ref 70–99)

## 2018-05-31 LAB — TROPONIN I
Troponin I: 0.21 ng/mL (ref <0.03)
Troponin I: 2.58 ng/mL (ref <0.03)
Troponin I: 12.77 ng/mL (ref <0.03)

## 2018-05-31 LAB — BRAIN NATRIURETIC PEPTIDE: B Natriuretic Peptide: 793.6 pg/mL — ABNORMAL HIGH (ref 0.0–100.0)

## 2018-05-31 LAB — MRSA PCR SCREENING: MRSA by PCR: NEGATIVE

## 2018-05-31 LAB — LACTIC ACID, PLASMA
Lactic Acid, Venous: 2.3 mmol/L (ref 0.5–1.9)
Lactic Acid, Venous: 2.7 mmol/L (ref 0.5–1.9)

## 2018-05-31 LAB — PROCALCITONIN: Procalcitonin: 0.34 ng/mL

## 2018-05-31 MED ORDER — ASPIRIN EC 81 MG PO TBEC: 81.0000 mg | DELAYED_RELEASE_TABLET | Freq: Every day | ORAL | Status: DC

## 2018-05-31 MED ORDER — SODIUM CHLORIDE 0.9 % IV BOLUS
500.0000 mL | Freq: Once | INTRAVENOUS | Status: AC
  Administered 2018-05-31: 500 mL via INTRAVENOUS

## 2018-05-31 MED ORDER — SODIUM CHLORIDE 0.9 % IV SOLN
500.0000 mg | Freq: Every day | INTRAVENOUS | Status: DC
  Filled 2018-05-31: qty 500

## 2018-05-31 MED ORDER — LACTATED RINGERS IV SOLN
INTRAVENOUS | Status: DC
  Administered 2018-05-31: 12:00:00 via INTRAVENOUS

## 2018-05-31 MED ORDER — ALBUTEROL (5 MG/ML) CONTINUOUS INHALATION SOLN
INHALATION_SOLUTION | RESPIRATORY_TRACT | Status: AC
  Administered 2018-05-31: 15 mg/h
  Filled 2018-05-31: qty 20

## 2018-05-31 MED ORDER — INSULIN ASPART 100 UNIT/ML ~~LOC~~ SOLN
0.0000 [IU] | Freq: Three times a day (TID) | SUBCUTANEOUS | Status: DC
  Administered 2018-05-31 (×2): 3 [IU] via SUBCUTANEOUS

## 2018-05-31 MED ORDER — DEXTROSE-NACL 5-0.45 % IV SOLN: INTRAVENOUS | Status: DC

## 2018-05-31 MED ORDER — INSULIN ASPART 100 UNIT/ML ~~LOC~~ SOLN: 0.0000 [IU] | Freq: Every day | SUBCUTANEOUS | Status: DC

## 2018-05-31 MED ORDER — IOPAMIDOL (ISOVUE-370) INJECTION 76%
INTRAVENOUS | Status: AC
  Filled 2018-05-31: qty 100

## 2018-05-31 MED ORDER — SODIUM CHLORIDE 0.9 % IV BOLUS
1000.0000 mL | Freq: Once | INTRAVENOUS | Status: AC
  Administered 2018-05-31: 1000 mL via INTRAVENOUS

## 2018-05-31 MED ORDER — METOPROLOL TARTRATE 12.5 MG HALF TABLET: 12.5000 mg | ORAL_TABLET | Freq: Two times a day (BID) | ORAL | Status: DC

## 2018-05-31 MED ORDER — LORAZEPAM 2 MG/ML IJ SOLN
0.5000 mg | INTRAMUSCULAR | Status: DC | PRN
  Administered 2018-05-31: 0.5 mg via INTRAVENOUS
  Filled 2018-05-31: qty 1

## 2018-05-31 MED ORDER — AZITHROMYCIN 500 MG IV SOLR
INTRAVENOUS | Status: AC
  Filled 2018-05-31: qty 500

## 2018-05-31 MED ORDER — INSULIN REGULAR(HUMAN) IN NACL 100-0.9 UT/100ML-% IV SOLN
INTRAVENOUS | Status: DC
  Administered 2018-05-31: 3.5 [IU]/h via INTRAVENOUS
  Filled 2018-05-31: qty 100

## 2018-05-31 MED ORDER — INSULIN DETEMIR 100 UNIT/ML ~~LOC~~ SOLN
28.0000 [IU] | Freq: Every day | SUBCUTANEOUS | Status: DC
  Filled 2018-05-31: qty 0.28

## 2018-05-31 MED ORDER — ACETAMINOPHEN 650 MG RE SUPP: 650.0000 mg | Freq: Four times a day (QID) | RECTAL | Status: DC | PRN

## 2018-05-31 MED ORDER — HEPARIN BOLUS VIA INFUSION
4000.0000 [IU] | Freq: Once | INTRAVENOUS | Status: AC
  Administered 2018-05-31: 4000 [IU] via INTRAVENOUS
  Filled 2018-05-31: qty 4000

## 2018-05-31 MED ORDER — IPRATROPIUM BROMIDE 0.02 % IN SOLN
RESPIRATORY_TRACT | Status: AC
  Administered 2018-05-31: 0.5 mg
  Filled 2018-05-31: qty 5

## 2018-05-31 MED ORDER — SODIUM CHLORIDE 0.9 % IV SOLN
500.0000 mg | Freq: Once | INTRAVENOUS | Status: AC
  Administered 2018-05-31: 500 mg via INTRAVENOUS
  Filled 2018-05-31: qty 500

## 2018-05-31 MED ORDER — ONDANSETRON HCL 4 MG/2ML IJ SOLN: 4.0000 mg | Freq: Four times a day (QID) | INTRAMUSCULAR | Status: DC | PRN

## 2018-05-31 MED ORDER — POLYETHYLENE GLYCOL 3350 17 G PO PACK: 17.0000 g | PACK | Freq: Every day | ORAL | Status: DC | PRN

## 2018-05-31 MED ORDER — INSULIN DETEMIR 100 UNIT/ML ~~LOC~~ SOLN
15.0000 [IU] | Freq: Once | SUBCUTANEOUS | Status: AC
  Administered 2018-05-31: 15 [IU] via SUBCUTANEOUS
  Filled 2018-05-31 (×2): qty 0.15

## 2018-05-31 MED ORDER — CEFTRIAXONE SODIUM 1 G IJ SOLR
1.0000 g | Freq: Once | INTRAMUSCULAR | Status: AC
  Administered 2018-05-31: 1 g via INTRAVENOUS
  Filled 2018-05-31: qty 10

## 2018-05-31 MED ORDER — SODIUM CHLORIDE 0.9% FLUSH
3.0000 mL | Freq: Two times a day (BID) | INTRAVENOUS | Status: DC
  Administered 2018-05-31: 3 mL via INTRAVENOUS

## 2018-05-31 MED ORDER — SODIUM CHLORIDE 0.9 % IV SOLN
INTRAVENOUS | Status: AC
  Filled 2018-05-31: qty 1

## 2018-05-31 MED ORDER — SODIUM CHLORIDE 0.9 % IV SOLN
1.0000 g | INTRAVENOUS | Status: DC
  Filled 2018-05-31: qty 10

## 2018-05-31 MED ORDER — ONDANSETRON HCL 4 MG PO TABS: 4.0000 mg | ORAL_TABLET | Freq: Four times a day (QID) | ORAL | Status: DC | PRN

## 2018-05-31 MED ORDER — HEPARIN (PORCINE) 25000 UT/250ML-% IV SOLN
700.0000 [IU]/h | INTRAVENOUS | Status: DC
  Administered 2018-05-31: 700 [IU]/h via INTRAVENOUS
  Filled 2018-05-31: qty 250

## 2018-05-31 MED ORDER — IOPAMIDOL (ISOVUE-370) INJECTION 76%
75.0000 mL | Freq: Once | INTRAVENOUS | Status: AC | PRN
  Administered 2018-05-31: 75 mL via INTRAVENOUS

## 2018-05-31 MED ORDER — LATANOPROST 0.005 % OP SOLN
1.0000 [drp] | Freq: Every day | OPHTHALMIC | Status: DC
  Filled 2018-05-31: qty 2.5

## 2018-05-31 MED ORDER — DEXAMETHASONE SODIUM PHOSPHATE 10 MG/ML IJ SOLN
10.0000 mg | Freq: Once | INTRAMUSCULAR | Status: AC
  Administered 2018-05-31: 10 mg via INTRAVENOUS
  Filled 2018-05-31: qty 1

## 2018-05-31 MED ORDER — MAGNESIUM SULFATE 2 GM/50ML IV SOLN
2.0000 g | Freq: Once | INTRAVENOUS | Status: AC
  Administered 2018-05-31: 2 g via INTRAVENOUS
  Filled 2018-05-31: qty 50

## 2018-05-31 MED ORDER — ROSUVASTATIN CALCIUM 20 MG PO TABS: 40.0000 mg | ORAL_TABLET | Freq: Every day | ORAL | Status: DC

## 2018-05-31 MED ORDER — METOPROLOL TARTRATE 5 MG/5ML IV SOLN: 5.0000 mg | Freq: Four times a day (QID) | INTRAVENOUS | Status: DC | PRN

## 2018-05-31 MED ORDER — ENOXAPARIN SODIUM 40 MG/0.4ML ~~LOC~~ SOLN: 40.0000 mg | SUBCUTANEOUS | Status: DC

## 2018-05-31 MED ORDER — ALBUTEROL SULFATE (2.5 MG/3ML) 0.083% IN NEBU
2.5000 mg | INHALATION_SOLUTION | RESPIRATORY_TRACT | Status: DC | PRN
  Administered 2018-05-31 (×2): 2.5 mg via RESPIRATORY_TRACT
  Filled 2018-05-31 (×2): qty 3

## 2018-05-31 MED ORDER — CLOPIDOGREL BISULFATE 75 MG PO TABS: 75.0000 mg | ORAL_TABLET | Freq: Every day | ORAL | Status: DC

## 2018-05-31 MED ORDER — PREDNISONE 20 MG PO TABS
60.0000 mg | ORAL_TABLET | Freq: Every day | ORAL | Status: DC
  Administered 2018-05-31: 60 mg via ORAL
  Filled 2018-05-31: qty 1

## 2018-05-31 MED ORDER — ACETAMINOPHEN 325 MG PO TABS: 650.0000 mg | ORAL_TABLET | Freq: Four times a day (QID) | ORAL | Status: DC | PRN

## 2018-05-31 MED ORDER — BRIMONIDINE TARTRATE 0.2 % OP SOLN
1.0000 [drp] | Freq: Two times a day (BID) | OPHTHALMIC | Status: DC
  Filled 2018-05-31: qty 5

## 2018-05-31 MED ORDER — DOCUSATE SODIUM 100 MG PO CAPS: 100.0000 mg | ORAL_CAPSULE | Freq: Two times a day (BID) | ORAL | Status: DC

## 2018-06-01 MED FILL — Medication: Qty: 1 | Status: AC

## 2018-06-05 LAB — CULTURE, BLOOD (ROUTINE X 2)
CULTURE: NO GROWTH
Culture: NO GROWTH
Special Requests: ADEQUATE
Special Requests: ADEQUATE

## 2018-06-09 NOTE — Plan of Care (Signed)
  Problem: Health Behavior/Discharge Planning: Goal: Ability to manage health-related needs will improve Outcome: Progressing   Problem: Clinical Measurements: Goal: Ability to maintain clinical measurements within normal limits will improve Outcome: Progressing Goal: Will remain free from infection Outcome: Progressing Goal: Diagnostic test results will improve Outcome: Progressing Goal: Respiratory complications will improve Outcome: Progressing Goal: Cardiovascular complication will be avoided Outcome: Progressing   Problem: Activity: Goal: Risk for activity intolerance will decrease Outcome: Progressing   Problem: Nutrition: Goal: Adequate nutrition will be maintained Outcome: Progressing   Problem: Coping: Goal: Level of anxiety will decrease Outcome: Progressing   Problem: Elimination: Goal: Will not experience complications related to bowel motility Outcome: Progressing Goal: Will not experience complications related to urinary retention Outcome: Progressing   Problem: Pain Managment: Goal: General experience of comfort will improve Outcome: Progressing   Problem: Safety: Goal: Ability to remain free from injury will improve Outcome: Progressing   Problem: Skin Integrity: Goal: Risk for impaired skin integrity will decrease Outcome: Progressing   Problem: Activity: Goal: Ability to tolerate increased activity will improve Outcome: Progressing   Problem: Clinical Measurements: Goal: Ability to maintain a body temperature in the normal range will improve Outcome: Progressing   Problem: Respiratory: Goal: Ability to maintain adequate ventilation will improve Outcome: Progressing Goal: Ability to maintain a clear airway will improve Outcome: Progressing   

## 2018-06-09 NOTE — ED Notes (Signed)
Blood sugar is 544 per lab. Dr. Wilkie AyeHorton aware.

## 2018-06-09 NOTE — Progress Notes (Signed)
Pt sats and HR dropped, code blue called. Dr Marchelle Gearingamaswamy, Dr Ophelia Charteryates notified to come to CT room 3.

## 2018-06-09 NOTE — H&P (Signed)
History and Physical    Kathleen BelfastMary Rosato ZOX:[id number] DOB: [birth year] DOA: 02-23-18  PCP: System, P[place] In [place] Consultants:  None Patient coming from:  Home - lives with daughter while in [place] and [place] her son [place]; [place]: Daughter, son - [phone]  Chief Complaint: SOB  HPI: Kathleen Lyons is a 85 y.o. female with medical history significant of CVA; CAD; HTN; and DM [facility].  She has been complaining of a cold.  She was trying to use OTC cough syrups but her daughter didn't want to give them.  Overnight, she felt like she was having an asthma attack.  She seemed to be breathing real hard with abdominal breathing.  She felt too weak to move.  She has been having cold symptoms for about a week - but has chronic rhinorrhea.  Her daughter has not noticed her coughing, but the patient has been complaining of being up all night coughing.  + sick contacts.  The patient's grandson also used really strong ingredients in the vaporizer and it was burning their eyes, they thought maybe it was too strong.  No fever, "she's been normal."  Cough is nonproductive.  She is feeling more comfortable on the BIPAP.  She is diabetic and loves sweets.   [facility]:  85 year old female with history of diabetes mellitus hypertension presents with worsening shortness of breath over the last 24 hours. Was found to be wheezing chest x-ray shows pulmonary edema-like picture but ER physician feels patient probably likely has pneumonia for which patient is empirically started on antibiotics. Patient's troponin is slightly elevated but patient did not complain of any chest pain. Patient admitted for acute respiratory failure likely from pneumonia/bronchitis as per the ER physician. Blood sugars been elevated ER physician may start patient on Glucomander.  Addendum - patient was placed on BIPAP for hypoxia. EDP says patient is stable. Will Be folowing closely for  deterioration.  10-15 minutes before transport BP dropped to 80/40. Given 500 cc bolus with improved BP. Repeat CXR with evolving PNA not previously seen. If BP drops again, likely needs more fluid resuscitation for entire 30 cc/kg.  Review of Systems: As per HPI; otherwise review of systems reviewed and negative.   Ambulatory Status:  Ambulates without assistance  Past Medical History:  Diagnosis Date  . Asthma   . Coronary artery disease   . Diabetes mellitus without complication (HCC)   . Hypertension   . Stroke [facility](HCC) 03/2013    Past Surgical History:  Procedure Laterality Date  . BREAST SURGERY      Social History   Socioeconomic History  . Marital status: Single    Spouse name: Not on file  . Number of children: Not on file  . Years of education: Not on file  . Highest education level: Not on file  Occupational History  . Occupation: retired  Engineer, productionocial Needs  . Financial resource strain: Not on file  . Food insecurity:    Worry: Not on file    Inability: Not on file  . Transportation needs:    Medical: Not on file    Non-medical: Not on file  Tobacco Use  . Smoking status: Never Smoker  . Smokeless tobacco: Never Used  Substance and Sexual Activity  . Alcohol use: No  . Drug use: No  . Sexual activity: Not on file  Lifestyle  . Physical activity:    Days per week: Not on file  Minutes per session: Not on file  . Stress: Not on file  Relationships  . Social connections:    Talks on phone: Not on file    Gets together: Not on file    Attends religious service: Not on file    Active member of club or organization: Not on file    Attends meetings of clubs or organizations: Not on file    Relationship status: Not on file  . Intimate partner violence:    Fear of current or ex partner: Not on file    Emotionally abused: Not on file    Physically abused: Not on file    Forced sexual activity: Not on file  Other Topics Concern  . Not on file  Social  History Narrative  . Not on file    No Known Allergies  History reviewed. No pertinent family history.  Prior to Admission medications   Medication Sig Start Date End Date Taking? Authorizing Provider  albuterol (PROVENTIL HFA;VENTOLIN HFA) 108 (90 BASE) MCG/ACT inhaler Inhale 1-2 puffs into the lungs every 6 (six) hours as needed for wheezing or shortness of breath.    [provider]  amLODipine (NORVASC) 10 MG tablet Take 10 mg by mouth daily.    [provider]  amLODipine (NORVASC) 10 MG tablet Take 1 tablet (10 mg total) by mouth daily. 05/17/13   Purvis SheffieldHarrison, Forrest, MD  aspirin EC 81 MG tablet Take 81 mg by mouth daily.    [provider]  brimonidine (ALPHAGAN) 0.2 % ophthalmic solution Place 1 drop into both eyes 2 (two) times daily.     [provider]  Carbamide Peroxide (EAR WAX DROPS) 6.5 % SOLN Place 4 drops in ear(s) 2 (two) times daily. 03/16/15   Gwyneth SproutPlunkett, Whitney, MD  carvedilol (COREG) 12.5 MG tablet Take 12.5 mg by mouth 2 (two) times daily with a meal.    [provider]  carvedilol (COREG) 12.5 MG tablet Take 1 tablet (12.5 mg total) by mouth 2 (two) times daily with a meal. 05/17/13   Purvis SheffieldHarrison, Forrest, MD  cefpodoxime (VANTIN) 200 MG tablet Take 1 tablet (200 mg total) by mouth 2 (two) times daily. 05/17/13   Purvis SheffieldHarrison, Forrest, MD  clopidogrel (PLAVIX) 75 MG tablet Take 75 mg by mouth daily with breakfast.    [provider]  insulin aspart (NOVOLOG) 100 UNIT/ML injection Inject 3 Units into the skin 3 (three) times daily before meals.     [provider]  insulin detemir (LEVEMIR) 100 UNIT/ML injection Inject 28 Units into the skin at bedtime.     [provider]  irbesartan (AVAPRO) 300 MG tablet Take 300 mg by mouth daily.    [provider]  irbesartan (AVAPRO) 300 MG tablet Take 1 tablet (300 mg total) by mouth daily. 05/17/13   Purvis SheffieldHarrison, Forrest, MD  latanoprost (XALATAN) 0.005 %  ophthalmic solution Place 1 drop into both eyes at bedtime.    [provider]  meclizine (ANTIVERT) 25 MG tablet Take 1 tablet (25 mg total) by mouth 3 (three) times daily as needed for dizziness. 06/05/13   [name], [name], MD  rosuvastatin (CRESTOR) 40 MG tablet Take 40 mg by mouth at bedtime.     [provider]  rosuvastatin (CRESTOR) 40 MG tablet Take 1 tablet (40 mg total) by mouth daily. 06/05/13   [name], [name], MD    Physical Exam: Vitals:   May 01, 2018 0953 May 01, 2018 1003 May 01, 2018 1136 May 01, 2018 1138  BP:  101/67 112/75  Pulse: 99 98 (!) 101 98  Resp: (!) 28 (!) 25 (!) 28 (!) 24  Temp:      TempSrc:      SpO2: 97% 99% 100% 100%  Weight:  64.8 kg    Height:  5\' 1"  (1.549 m)       General: Appears calm and comfortable and is NAD, onBIPAP Eyes:  EOMI, normal lids, iris ENT:  Mildly hard of hearing, BIPAP in place Neck:  no JVD, LAD, masses or thyromegaly Cardiovascular:  RR with mild tachycardia, no m/r/g. No LE edema.  Respiratory:   CTA bilaterally with scattered wheezes and rhonchi.  Mildly increased respiratory effort on BIPAP which patient and her daughter report is improved  Abdomen:  soft, NT, ND, NABS Skin:  no rash or induration seen on limited exam Musculoskeletal:  grossly normal tone BUE/BLE, good ROM, no bony abnormality Lower extremity:  No LE edema.  Limited foot exam with no ulcerations.  2+ distal pulses. Psychiatric:  blunted mood and affect, speech fluent and appropriate, AOx3 Neurologic:  CN 2-12 grossly intact, moves all extremities in coordinated fashion   Radiological Exams on Admission: Dg Chest Portable 1 View  Result Date: 06-07-18 CLINICAL DATA:  Shortness of breath for the past several hours. Cough for the past several days. EXAM: PORTABLE CHEST 1 VIEW COMPARISON:  06/07/2018 FINDINGS: Grossly unchanged cardiac silhouette and mediastinal contours with atherosclerotic plaque within thoracic aorta. Pulmonary vasculature appears  less distinct than present examination with cephalization of flow. Worsening bilateral mid and lower lung heterogeneous opacities, right greater than left, potentially accentuated due to overlying soft tissues. No pleural effusion or pneumothorax. No acute osseous abnormalities. Degenerative change of the right glenohumeral joint, incompletely evaluated. IMPRESSION: 1. Right lower lung heterogeneous opacities worrisome for developing pneumonia. A follow-up chest radiograph in 3 to 4 weeks after treatment is recommended to ensure resolution. 2. Suspected mild pulmonary edema. Electronically Signed   By: Simonne Come M.D.   On: 07-Jun-2018 08:27   Dg Chest Portable 1 View  Result Date: 07-Jun-2018 CLINICAL DATA:  Acute onset of shortness of breath and cough. Decreased O2 saturation. EXAM: PORTABLE CHEST 1 VIEW COMPARISON:  None. FINDINGS: The lungs are well-aerated. Vascular congestion is noted. Increased interstitial markings raise concern for pulmonary edema. There is no evidence of pleural effusion or pneumothorax. The cardiomediastinal silhouette is mildly enlarged. No acute osseous abnormalities are seen. IMPRESSION: Vascular congestion and mild cardiomegaly. Increased interstitial markings raise concern for pulmonary edema. Electronically Signed   By: Roanna Raider M.D.   On: 06-07-2018 05:14    EKG: Independently reviewed.  Sinus tachycardia with rate 104; new LBBB noted   Labs on Admission: I have personally reviewed the available labs and imaging studies at the time of the admission.  Pertinent labs:   Glucose 544 BUN 28/Creatinine 1.05/GFR 57 Anion gap 12 CO2 25 BNP 793.6 Troponin 0.21 WBC 17.0 Lactate 2.68, 2.06, 2.3 ABG: 7.374/49.1/64.0/28.7 Flu negative A1c 8.1  Assessment/Plan Principal Problem:   Acute respiratory failure with hypoxia (HCC) Active Problems:   Sepsis due to pneumonia [facility])   Essential hypertension   Elevated troponin   Uncontrolled diabetes mellitus type 2  without complications (HCC)   Acute respiratory failure with hypoxia -Patient presenting with SOB -Initial CXR appeared to show pulmonary edema, but patient's presentation was more c/w infectious cause -Repeat CXR appeared to more clearly show PNA as source -She was placed on BIPAP with improvement in symptoms -Admit to progressive care for ongoing close  monitoring  Sepsis due to PNA -SIRS criteria in this patient includes: Leukocytosis, tachycardia, tachypnea, hypoxia  -Patient has evidence of acute organ failure with elevated lactate and  hypotension -While awaiting blood cultures, this appears to be a preseptic condition. -Sepsis protocol initiated -Patient was not given the 30 cc/kg IVF bolus in the ER due to initial concern for volume overload and then stabilization of BP with IVF provided.   -However, lactate did not clear and in fact went up a small amount and so ongoing 500 cc bolus was given and she has now received her full sepsis bolus -Suspected source is PNA given cough, decreased oxygen saturation, and developing infiltrate in right lower lobe on chest x-ray -Influenza negative. -CURB-65 score is 3, meaning that the patient has a 14.5% risk of death. -Pneumonia Severity Index (PSI) is Class 5, 27% mortality. -Corticosteroids have been to shown to low overall mortality rate; risk of ARDS; and need for mechanical ventilation.  This is particularly true in severe PNA (class 3+ PSI).  Will add 60 mg prednisone daily for 5 days. -Will start Azithromycin 500 mg daily AND Rocephin due to no risk factors for MDR cause. -Additional complicating factors include: hypoxia/hypoxemia; persistent lactic acidosis. -LR @ 100 cc/hr -Fever control -Repeat CBC in am -Sputum cultures -Will order lower respiratory tract procalcitonin level.  Antibiotics would not be indicated for PCT <0.1 and probably should not be used for < 0.25.  >0.5 indicates infection and >>0.5 indicates more serious  disease.  As the procalcitonin level normalizes, it will be reasonable to consider de-escalation of antibiotic coverage. -Albuterol PRN -Blood cultures pending -Will admit due to: hemodynamic instability; hypoxemia; and BIPAP-dependent respiratory failure -Will trend lactate to ensure improvement  Elevated troponin -Likely related to demand ischemia, as patient does not have reported chest pain -Will consult cardiology -Will defer decision about heparin as well as cardiac catheterization prior to d/c to cardiology  Uncontrolled DM -Patient was started on insulin drip while at [facility] -However, she appears to have suboptimal control at baseline (A1c 8.1); and does not have an anion gap or depressed bicarbonate -As such, she was given a one-time dose of Levemir now for coverage with resumption of her qhs Levemir tonight -She will also be covered with sensitive-scale SSI at this time  HTN -Hold Norvasc, Coreg, and Avapro at this time due to hypotension in the setting of sepsis -These may be able to be resumed tomorrow or even sooner if her BP stabilizes and normalizes  DVT prophylaxis:  Lovenox Code Status:  Full - confirmed with patient/family Family Communication: Daughter and son-in-law present throughout evaluation  Disposition Plan:  Home once clinically improved Consults called: Cardiology Admission status: Admit - It is my clinical opinion that admission to INPATIENT is reasonable and necessary because of the expectation that this patient will require hospital care that crosses at least 2 midnights to treat this condition based on the medical complexity of the problems presented.  Given the aforementioned information, the predictability of an adverse outcome is felt to be significant.      Jonah Blue MD [facility]  If note is complete, please contact covering daytime or nighttime physician. [email or web address] Password [email or web address]  2018/06/12, 2:16 PM

## 2018-06-09 NOTE — Progress Notes (Signed)
Pt off of NIV. On 5L/[place].  No distress noted.  WOB is normal.

## 2018-06-09 NOTE — ED Notes (Signed)
Date and time results received: 04-13-2018 0530 (use smartphrase ".now" to insert current time)  Test: ISTAT Lactic Acid Critical Value: 2.68  Name of Provider Notified: Dr.C.Horton  Orders Received? Or Actions Taken?:

## 2018-06-09 NOTE — ED Notes (Signed)
Report to [facility] staff at bedside, pt care transferred. Pt and family aware of plan of care, pending transport and inpatient admit to progressive care unit at [facility], primary nurse Okey RegalCarol has been given report.

## 2018-06-09 NOTE — Progress Notes (Signed)
Patient with persistent elevation in lactate despite appropriate resuscitation for sepsis.  She otherwise appears to be clinically stable and improving on BIPAP.  Her troponin is also uptrending.  As a result, I have: 1- consulted cardiology 2- discussed with Dr. Marchelle Gearingamaswamy 3- am continuing to trend lactate 4- will order CTA to r/o PE as the cause for her respiratory failure and increased troponin 5- will continue to consider other sources for lactic acidosis  For now, there does not appear to be an indication for PCCM consultation.  However, if her lactate continues to uptrend, will need to consider that.    Kathleen CurioJennifer E. Andra Lyons, M.D.

## 2018-06-09 NOTE — Progress Notes (Signed)
   09/23/2017 1948  Clinical Encounter Type  Visited With Family;Health care provider  Visit Type Code;Critical Care;Trauma  Referral From Nurse  Consult/Referral To Chaplain  Spiritual Encounters  Spiritual Needs Emotional;Grief support  Stress Factors  Family Stress Factors Loss   Responded to a code blue for this PT in CT.  Went with MD to alert family to change in PT.  Stayed with the family moving up to 2H.  PT did not survive.  With family at bedside as they grieve.  Daughter, son in law and 2 grandchildren present.  Will follow and continue to support Chaplain Agustin CreeNewton Jaxiel Kines

## 2018-06-09 NOTE — ED Provider Notes (Addendum)
[facility] EMERGENCY DEPARTMENT Provider Note   CSN: [id number] Arrival date & time: 14-Jun-2018  0441     History   Chief Complaint Chief Complaint  Patient presents with  . Shortness of Breath    HPI Kathleen Lyons is a 85 y.o. female.  HPI  This is an 85 year old female with a history of asthma, coronary artery disease, diabetes who presents with shortness of breath.  Reports onset of shortness of breath last night at 9 PM.  Reports acute worsening overnight.  Reports prior to this she had a nonproductive cough.  She lives with her daughter.  Daughter states that she gave her cough syrup.  She is not had any noted fevers.  No recent issues with asthma and she does not routinely use an inhaler.  Daughter reports that over the last 5 years she cannot remember her having any issues with her asthma.  Patient denies any chest pain.  He denies any nausea, vomiting, diarrhea.  Daughter reports that she is very independent and she only noted her shortness of breath this morning.  However, she was able to get herself dressed on her own.  Denies history of heart failure or lower extremity swelling.  Denies worsening of breathing with laying flat.  Past Medical History:  Diagnosis Date  . Asthma   . Coronary artery disease   . Diabetes mellitus without complication (HCC)   . Hypertension   . Myocardial infarction (HCC)   . Stroke [facility])     Patient Active Problem List   Diagnosis Date Noted  . Acute respiratory failure with hypoxia (HCC) 06/14/2018  . Hematuria 05/17/2013  . UTI (lower urinary tract infection) 05/17/2013    History reviewed. No pertinent surgical history.   OB History   No obstetric history on file.      Home Medications    Prior to Admission medications   Medication Sig Start Date End Date Taking? Authorizing Provider  albuterol (PROVENTIL HFA;VENTOLIN HFA) 108 (90 BASE) MCG/ACT inhaler Inhale 1-2 puffs into the lungs every 6 (six) hours as  needed for wheezing or shortness of breath.    [provider]  amLODipine (NORVASC) 10 MG tablet Take 10 mg by mouth daily.    [provider]  amLODipine (NORVASC) 10 MG tablet Take 1 tablet (10 mg total) by mouth daily. 05/17/13   [name], MD  aspirin EC 81 MG tablet Take 81 mg by mouth daily.    [provider]  brimonidine (ALPHAGAN) 0.2 % ophthalmic solution Place 1 drop into both eyes 2 (two) times daily.     [provider]  Carbamide Peroxide (EAR WAX DROPS) 6.5 % SOLN Place 4 drops in ear(s) 2 (two) times daily. 03/16/15   [name], MD  carvedilol (COREG) 12.5 MG tablet Take 12.5 mg by mouth 2 (two) times daily with a meal.    [provider]  carvedilol (COREG) 12.5 MG tablet Take 1 tablet (12.5 mg total) by mouth 2 (two) times daily with a meal. 05/17/13   [name], MD  cefpodoxime (VANTIN) 200 MG tablet Take 1 tablet (200 mg total) by mouth 2 (two) times daily. 05/17/13   [name], MD  clopidogrel (PLAVIX) 75 MG tablet Take 75 mg by mouth daily with breakfast.    [provider]  insulin aspart (NOVOLOG) 100 UNIT/ML injection Inject 3 Units into the skin 3 (three) times daily before meals.     [provider]  insulin detemir (LEVEMIR) 100  UNIT/ML injection Inject 28 Units into the skin at bedtime.     [provider]  irbesartan (AVAPRO) 300 MG tablet Take 300 mg by mouth daily.    [provider]  irbesartan (AVAPRO) 300 MG tablet Take 1 tablet (300 mg total) by mouth daily. 05/17/13   Purvis SheffieldHarrison, Forrest, MD  latanoprost (XALATAN) 0.005 % ophthalmic solution Place 1 drop into both eyes at bedtime.    [provider]  meclizine (ANTIVERT) 25 MG tablet Take 1 tablet (25 mg total) by mouth 3 (three) times daily as needed for dizziness. 06/05/13   [name], [name], MD  rosuvastatin (CRESTOR) 40 MG tablet Take 40 mg by mouth at bedtime.     [provider]    rosuvastatin (CRESTOR) 40 MG tablet Take 1 tablet (40 mg total) by mouth daily. 06/05/13   [name], [name], MD    Family History No family history on file.  Social History Social History   Tobacco Use  . Smoking status: Never Smoker  . Smokeless tobacco: Never Used  Substance Use Topics  . Alcohol use: No  . Drug use: No     Allergies   Patient has no known allergies.   Review of Systems Review of Systems  Constitutional: Negative for chills and fever.  Respiratory: Positive for cough and shortness of breath.   Cardiovascular: Negative for chest pain.  Gastrointestinal: Negative for abdominal pain, nausea and vomiting.  Genitourinary: Negative for dysuria.  All other systems reviewed and are negative.    Physical Exam Updated Vital Signs BP 116/75 (BP Location: Left Arm)   Pulse (!) 103   Temp 97.8 F (36.6 C) (Axillary)   Resp (!) 31   Ht 1.524 m (5')   Wt 67.1 kg   SpO2 91%   BMI 28.90 kg/m   Physical Exam Vitals signs and nursing note reviewed.  Constitutional:      Appearance: She is well-developed.     Comments: Elderly, ill-appearing but nontoxic  HENT:     Head: Normocephalic and atraumatic.  Eyes:     Pupils: Pupils are equal, round, and reactive to light.  Neck:     Musculoskeletal: Neck supple.  Cardiovascular:     Rate and Rhythm: Regular rhythm.     Heart sounds: Normal heart sounds.     Comments: Tachycardia Pulmonary:     Effort: Tachypnea, accessory muscle usage and respiratory distress present.     Breath sounds: No wheezing.     Comments: Diminished breath sounds in all lung fields, poor air movement, tight, no wheezing noted, tachypnea, able to speak in short sentences Abdominal:     General: Bowel sounds are normal.     Palpations: Abdomen is soft.  Musculoskeletal:     Right lower leg: No edema.     Left lower leg: No edema.  Skin:    General: Skin is warm and dry.  Neurological:     Mental Status: She is alert and oriented  to person, place, and time.      ED Treatments / Results  Labs (all labs ordered are listed, but only abnormal results are displayed) Labs Reviewed  CBC WITH DIFFERENTIAL/PLATELET - Abnormal; Notable for the following components:      Result Value   WBC 17.0 (*)    Neutro Abs 13.8 (*)    Abs Immature Granulocytes 0.08 (*)    All other components within normal limits  BASIC METABOLIC PANEL - Abnormal; Notable for the following components:  Glucose, Bld 544 (*)    BUN 28 (*)    Creatinine, Ser 1.05 (*)    GFR calc non Af Amer 49 (*)    GFR calc Af Amer 57 (*)    All other components within normal limits  TROPONIN I - Abnormal; Notable for the following components:   Troponin I 0.21 (*)    All other components within normal limits  BRAIN NATRIURETIC PEPTIDE - Abnormal; Notable for the following components:   B Natriuretic Peptide 793.6 (*)    All other components within normal limits  I-STAT CG4 LACTIC ACID, ED - Abnormal; Notable for the following components:   Lactic Acid, Venous 2.68 (*)    All other components within normal limits  I-STAT ARTERIAL BLOOD GAS, ED - Abnormal; Notable for the following components:   pCO2 arterial 49.1 (*)    pO2, Arterial 64.0 (*)    Bicarbonate 28.7 (*)    Acid-Base Excess 3.0 (*)    All other components within normal limits  CULTURE, BLOOD (ROUTINE X 2)  CULTURE, BLOOD (ROUTINE X 2)  URINALYSIS, ROUTINE W REFLEX MICROSCOPIC  INFLUENZA PANEL BY PCR (TYPE A & B)    EKG EKG Interpretation  Date/Time:  Monday May 31 2018 05:05:18 EST Ventricular Rate:  104 PR Interval:    QRS Duration: 126 QT Interval:  364 QTC Calculation: 479 R Axis:   -56 Text Interpretation:  Sinus or ectopic atrial tachycardia Left bundle branch block LBBB new when compared to prior Confirmed by Ross Marcus,  ([id number]) on 2018/05/11 5:08:49 AM   Radiology Dg Chest Portable 1 View  Result Date: 2018/05/11 CLINICAL DATA:  Acute onset of shortness of  breath and cough. Decreased O2 saturation. EXAM: PORTABLE CHEST 1 VIEW COMPARISON:  None. FINDINGS: The lungs are well-aerated. Vascular congestion is noted. Increased interstitial markings raise concern for pulmonary edema. There is no evidence of pleural effusion or pneumothorax. The cardiomediastinal silhouette is mildly enlarged. No acute osseous abnormalities are seen. IMPRESSION: Vascular congestion and mild cardiomegaly. Increased interstitial markings raise concern for pulmonary edema. Electronically Signed   By: Roanna RaiderJeffery  Chang M.D.   On: 2018/05/11 05:14    Procedures Procedures (including critical care time)  CRITICAL CARE Performed by: Shon Batonourtney F    Total critical care time: 60 minutes  Critical care time was exclusive of separately billable procedures and treating other patients.  Critical care was necessary to treat or prevent imminent or life-threatening deterioration.  Critical care was time spent personally by me on the following activities: development of treatment plan with patient and/or surrogate as well as nursing, discussions with consultants, evaluation of patient's response to treatment, examination of patient, obtaining history from patient or surrogate, ordering and performing treatments and interventions, ordering and review of laboratory studies, ordering and review of radiographic studies, pulse oximetry and re-evaluation of patient's condition.   Medications Ordered in ED Medications  sodium chloride 0.9 % bolus 1,000 mL (1,000 mLs Intravenous New Bag/Given 11-21-17 0551)  azithromycin (ZITHROMAX) 500 mg in sodium chloride 0.9 % 250 mL IVPB (500 mg Intravenous New Bag/Given 11-21-17 0615)  azithromycin (ZITHROMAX) 500 MG injection (has no administration in time range)  dextrose 5 %-0.45 % sodium chloride infusion (has no administration in time range)  insulin regular, human (MYXREDLIN) 100 units/ 100 mL infusion (has no administration in time range)    ipratropium (ATROVENT) 0.02 % nebulizer solution (0.5 mg  Given 11-21-17 0512)  albuterol (PROVENTIL, VENTOLIN) (5 MG/ML) 0.5% continuous inhalation solution (15 mg/hr  Given 06-29-2018 0512)  dexamethasone (DECADRON) injection 10 mg (10 mg Intravenous Given 2018/06/29 0537)  magnesium sulfate IVPB 2 g 50 mL (2 g Intravenous New Bag/Given 06-29-2018 0549)  cefTRIAXone (ROCEPHIN) 1 g in sodium chloride 0.9 % 100 mL IVPB (1 g Intravenous New Bag/Given Jun 29, 2018 0552)     Initial Impression / Assessment and Plan / ED Course  I have reviewed the triage vital signs and the nursing notes.  Pertinent labs & imaging results that were available during my care of the patient were reviewed by me and considered in my medical decision making (see chart for details).  Clinical Course as of May 31 628  Mon 2018/06/29  1610 Patient afebrile but initial lab work with a leukocytosis to 17 and glucose of 544.  No anion gap.  Given these derangements, septic work-up was initiated.  Blood cultures and a liter of fluids was ordered.  She is not hypotensive.  Will obtain lactate and ABG.  She was given broad-spectrum antibiotics with Rocephin and azithromycin.   [CH]  0553 Troponin noted to be 0.21.  No active chest pain.  Patient will be given aspirin.  Suspect that this is likely demand related.  On recheck, she clinically appears better.  She continues to have a neb going.  She now is moving better air with faint wheeze.  ABG and BNP are pending.  Chest x-ray was read as pulmonary edema; however, clinically she has no evidence of volume overload.  No echo in the chart.  She is improving with breathing treatments.  Again clinically more suspicious for URI versus pneumonia versus reactive airway disease.  She will likely need echocardiogram and will need repeat troponins for trending.  EKG is without any evidence of acute ischemia.  She is hyperglycemic to 544.  1 L fluids is going.  Reports compliance with medications.   Reports her blood sugars yesterday were in the 100s.  Troponin I(!!): 0.21 [CH]  9604 Spoke with Dr. Toniann Fail who has accepted the patient to the stepdown unit.  Will initiate glucose stabilizer.  I have reviewed her ABG.   [CH]  [phone] Patient has better aeration but now has become tachypneic again.  Given her work of breathing and hypoxia on ABG, will place on BiPAP.  Text page sent to hospitalist update.   [CH]    Clinical Course User Index [CH] , Mayer Masker, MD    She presents with shortness of breath.  Recent cough.  She denies any chest pain.  Reports history of asthma but no recent exacerbations over the last 5 years.  She does not use any inhaler routinely.  Initial vital signs notable for O2 sats in the low 80s on room air.  She is tachypneic.  She is afebrile.  Pulse rate in the low 100s.  She is not moving hardly any air and not wheezing.  She was started on a continuous DuoNeb at 15 and 5 with improvement of O2 sats.  I discussed with her that she may need BiPAP.  She is full code per discussion.  See clinical course above.  Initial work-up notable for leukocytosis and blood sugar of 544.  Patient was covered with broad-spectrum antibiotics and started on fluids.  Lactate is 0.68 and she is not hypotensive.  She does not appear to be in septic shock.  Primarily I believe she is in a hypoxic respiratory distress.  Troponin is minimally elevated at 0.21.  She has a history of coronary artery  disease.  No active chest pain.  No active ischemia noted on EKG.  She is primarily followed in [place] and this is where she was treated for her heart attack 5 years ago.  Patient was given full dose aspirin.  Feel this is likely demand related.  On recheck, patient clinically appears improved.  Tachypnea has improved.  She is moving better air now with some wheezing.  Chest x-ray was read as pulmonary edema although clinically patient does not have any evidence of volume overload and is  presenting much more as reactive airway disease versus pulmonary edema.  We will add a BNP.  She likely needs an echo and will need serial troponins as well.  We will plan for admission to the hospitalist.  Currently she is not in need of any advanced airway or BiPAP; however, respiratory status remains tenuous although improved.  Will discuss with admitting hospitalist.  Final Clinical Impressions(s) / ED Diagnoses   Final diagnoses:  Hypoxia  SOB (shortness of breath)  Severe asthma with exacerbation, unspecified whether persistent  Elevated troponin  Hyperglycemia    ED Discharge Orders    None       , Mayer Masker, MD Jun 04, 2018 1610    Shon Baton, MD 06/04/18 9604    Shon Baton, MD June 04, 2018 [phone]

## 2018-06-09 NOTE — Code Documentation (Signed)
  Patient Name: Kathleen Lyons   MRN: [id number]   Date of Birth/ Sex: 1932/10/30 , female      Admission Date: 2017-07-15  Attending Provider: Jonah BlueYates, Jennifer, MD  Primary Diagnosis: Acute respiratory failure with hypoxia [facility](HCC)   Indication: Pt was in her usual state of health until this PM, when she was noted to code while getting a CTA. Code blue was subsequently called. At the time of arrival on scene, ACLS protocol was underway.   Technical Description:  - CPR performance duration:  33 minutes  - Was defibrillation or cardioversion used? No   - Was external pacer placed? No  - Was patient intubated pre/post CPR? Yes   Medications Administered: Y = Yes; Blank = No Amiodarone  N  Atropine  Y  Calcium  N  Epinephrine  Y  Lidocaine  N  Magnesium  N  Norepinephrine  Y  Phenylephrine  N  Sodium bicarbonate  Y  Vasopressin  N   Post CPR evaluation:  - Final Status - Was patient successfully resuscitated ? Yes - What is current rhythm? PEA asystole - What is current hemodynamic status? hypotensive  Miscellaneous Information:  - Labs sent, including: none  - Primary team notified?  Yes  - Family Notified? Yes  - Additional notes/ transfer status:  transferred to 2 heart      Cheney Gosch N, DO  2017-07-15, 7:37 PM    Patient was transported to 2 Heart and coded again.    Patient Name: Kathleen BelfastMary Lyons   MRN: [id number]   Date of Birth/ Sex: 1932/10/30 , female      Admission Date: 2017-07-15  Attending Provider: Jonah BlueYates, Jennifer, MD  Primary Diagnosis: SOB (shortness of breath) [R06.02] Hyperglycemia [R73.9] Hypoxia [R09.02] Elevated troponin [R79.89] Severe asthma with exacerbation, unspecified whether persistent [J45.901]   Indication: Pt was transported to 2 heart and coded when she arrived to the room. Noted to be in PEA upon arrival to the room. Code blue was subsequently called.   Technical Description:  - CPR performance duration:  7 minutes  - Was  defibrillation or cardioversion used? No   - Was external pacer placed? No  - Was patient intubated pre/post CPR? Yes   Medications Administered: Y = Yes; Blank = No Amiodarone  N  Atropine  N  Calcium  N  Epinephrine  Y  Lidocaine  N  Magnesium  N  Norepinephrine  N  Phenylephrine  N  Sodium bicarbonate  N  Vasopressin  N  Other N   Post CPR evaluation:  - Final Status - Was patient successfully resuscitated ? No   Miscellaneous Information:  - Time of death:  7:22 PM  - Primary team notified?  Yes  - Family Notified? Yes     Lilas Diefendorf N, DO   2017-07-15, 7:23 PM

## 2018-06-09 NOTE — Progress Notes (Signed)
This note also relates to the following rows which could not be included: Pulse Rate - Cannot attach notes to unvalidated device data Resp - Cannot attach notes to unvalidated device data SpO2 - Cannot attach notes to unvalidated device data  Pt complained of Shortness of breath.  Pt placed back on NIV

## 2018-06-09 NOTE — Anesthesia Procedure Notes (Signed)
Procedure Name: Intubation Date/Time: 2018-06-08 6:38 PM Performed by: Moshe Salisbury, CRNA Pre-anesthesia Checklist: Patient identified, Emergency Drugs available, Suction available and Patient being monitored Patient Re-evaluated:Patient Re-evaluated prior to induction Oxygen Delivery Method: Circle System Utilized Preoxygenation: Pre-oxygenation with 100% oxygen Induction Type: IV induction Ventilation: Mask ventilation without difficulty Laryngoscope Size: Mac and 3 Grade View: Grade I Tube type: Oral Number of attempts: 2 Airway Equipment and Method: Stylet and Oral airway Placement Confirmation: ETT inserted through vocal cords under direct vision,  breath sounds checked- equal and bilateral and CO2 detector Secured at: 21 cm Tube secured with: Tape Dental Injury: Teeth and Oropharynx as per pre-operative assessment

## 2018-06-09 NOTE — Progress Notes (Signed)
ANTICOAGULATION CONSULT NOTE - Initial Consult  Pharmacy Consult for heparin Indication: chest pain/ACS  No Known Allergies  Patient Measurements: Height: 5\' 1"  (154.9 cm) Weight: 142 lb 13.7 oz (64.8 kg) IBW/kg (Calculated) : 47.8 Heparin Dosing Weight: 61kg  Vital Signs: Temp: 98 F (36.7 C) (12/23 1003) Temp Source: Axillary (12/23 1003) BP: 122/78 (12/23 1600) Pulse Rate: 114 (12/23 1628)  Labs: Recent Labs    2017/12/30 0449 2017/12/30 1117  HGB 13.6  --   HCT 45.2  --   PLT 204  --   CREATININE 1.05*  --   TROPONINI 0.21* 2.58*    Estimated Creatinine Clearance: 35 mL/min (A) (by C-G formula based on SCr of 1.05 mg/dL (H)).   Medical History: Past Medical History:  Diagnosis Date  . Asthma   . Coronary artery disease   . Diabetes mellitus without complication (HCC)   . Hypertension   . Stroke [facility](HCC) 03/2013     Assessment: [age] yoF admitted with troponinemia and SOB. Pt to start on IV heparin per pharmacy for ACS workup. CBC wnl, no OAC noted PTA.  Goal of Therapy:  Heparin level 0.3-0.7 units/ml Monitor platelets by anticoagulation protocol: Yes   Plan:  Heparin 4000 units x1 then 700 units/hr Check 8hr heparin level Follow-up plans for angiography  Fredonia HighlandMichael Bitonti, PharmD, BCPS Clinical Pharmacist [phone] Please check AMION for all [facility] numbers 04/24/18

## 2018-06-09 NOTE — Consult Note (Signed)
Cardiology Consultation:   Patient ID: Kathleen Lyons MRN: [id number]; DOB: 1933/01/07  Admit date: 07/24/2017 Date of Consult: 07/24/2017  Primary Care Provider: System, Pcp Not In Primary Cardiologist: [facility]  Patient Profile:   Kathleen BelfastMary Lyons is a 85 y.o. female with a hx of CAD, CVA, hypertension, diabetes who is being seen today for the evaluation of Elevated troponin at the request of Dr. Ophelia CharterYates.   [place]  History of MI in 2014 which treated medically with aspirin and Plavix.  No evaluation since then.  Followed with cardiologist regularly.  History of Present Illness:   Ms. Kathleen Lyons presented with acute onset shortness of breath worsened overnight.  Had cough with cold symptoms without fever.  She did have a sick contact.  Chest x-ray shows pulmonary edema however ER physician felt presume pneumonia and started on antibiotic.  Elevated blood sugar.  Hypoxia improved on BiPAP.  She did require bolus of normal saline for hypotension.  She was [facility] under internal medicine service for sepsis secondary to pneumonia in setting of acute respiratory failure with hypoxia.  Hemoglobin A1c 8.1.  Lactic acid elevated.  BNP 793.  Influenza panel negative. Troponin I 0.21>>2.58. Cardiology is asked for further evaluation.  She is on aspirin 81 mg, Plavix 75 mg.   She was active and doing household stuff, walking around her house and goes up and down off a stair without any exertional chest pain or pressure.  Past Medical History:  Diagnosis Date  . Asthma   . Coronary artery disease   . Diabetes mellitus without complication (HCC)   . Hypertension   . Stroke [facility](HCC) 03/2013    Past Surgical History:  Procedure Laterality Date  . BREAST SURGERY       Inpatient Medications: Scheduled Meds: . aspirin EC  81 mg Oral Daily  . azithromycin      . brimonidine  1 drop Both Eyes BID  . [START ON  06/01/2018] clopidogrel  75 mg Oral Q breakfast  . docusate sodium  100 mg Oral BID  . enoxaparin (LOVENOX) injection  40 mg Subcutaneous Q24H  . insulin aspart  0-5 Units Subcutaneous QHS  . insulin aspart  0-9 Units Subcutaneous TID WC  . insulin detemir  28 Units Subcutaneous QHS  . latanoprost  1 drop Both Eyes QHS  . predniSONE  60 mg Oral Q breakfast  . [START ON 06/01/2018] rosuvastatin  40 mg Oral Daily  . sodium chloride flush  3 mL Intravenous Q12H   Continuous Infusions: . [START ON 06/01/2018] azithromycin    . [START ON 06/01/2018] cefTRIAXone (ROCEPHIN)  IV    . lactated ringers 100 mL/hr at 09-07-2017 1144  . insulin (NOVOLIN-R) infusion Stopped (09-07-2017 1143)   PRN Meds: acetaminophen **OR** acetaminophen, albuterol, ondansetron **OR** ondansetron (ZOFRAN) IV, polyethylene glycol  Allergies:   No Known Allergies  Social History:   Social History   Socioeconomic History  . Marital status: Single    Spouse name: Not on file  . Number of children: Not on file  . Years of education: Not on file  . Highest education level: Not on file  Occupational History  . Occupation: retired  Engineer, productionocial Needs  . Financial resource strain: Not on file  . Food insecurity:    Worry: Not on file    Inability: Not on file  . Transportation needs:    Medical: Not on file  Non-medical: Not on file  Tobacco Use  . Smoking status: Never Smoker  . Smokeless tobacco: Never Used  Substance and Sexual Activity  . Alcohol use: No  . Drug use: No  . Sexual activity: Not on file  Lifestyle  . Physical activity:    Days per week: Not on file    Minutes per session: Not on file  . Stress: Not on file  Relationships  . Social connections:    Talks on phone: Not on file    Gets together: Not on file    Attends religious service: Not on file    Active member of club or organization: Not on file    Attends meetings of clubs or organizations: Not on file    Relationship status: Not on  file  . Intimate partner violence:    Fear of current or ex partner: Not on file    Emotionally abused: Not on file    Physically abused: Not on file    Forced sexual activity: Not on file  Other Topics Concern  . Not on file  Social History Narrative  . Not on file    Family History:   Denies family history of CAD  ROS:  Please see the history of present illness.  All other ROS reviewed and negative.     Physical Exam/Data:   Vitals:   May 03, 2018 1138 May 03, 2018 1513 May 03, 2018 1515 May 03, 2018 1518  BP:  135/88 135/88 135/88  Pulse: 98 (!) 107 (!) 108 (!) 104  Resp: (!) 24 (!) 26 (!) 25 (!) 26  Temp:      TempSrc:      SpO2: 100% 100% 100% 100%  Weight:      Height:        Intake/Output Summary (Last 24 hours) at 03/28/18 1538 Last data filed at 03/28/18 1400 Gross per 24 hour  Intake 762.22 ml  Output -  Net 762.22 ml   Filed Weights   May 03, 2018 0559 May 03, 2018 1003  Weight: 67.1 kg 64.8 kg   Body mass index is 26.99 kg/m.  General:  Well nourished, well developed, in no acute distress HEENT: normal Lymph: no adenopathy Neck: no JVD Endocrine:  No thryomegaly Vascular: No carotid bruits; FA pulses 2+ bilaterally without bruits  Cardiac:  normal S1, S2; RRR; no murmur Lungs:  clear to auscultation bilaterally, no wheezing, rhonchi or rales  Abd: soft, nontender, no hepatomegaly  Ext: no edema Musculoskeletal:  No deformities, BUE and BLE strength normal and equal Skin: warm and dry  Neuro:  CNs 2-12 intact, no focal abnormalities noted Psych:  Normal affect   EKG:  The EKG was personally reviewed and demonstrates: Sinus rhythm at rate of 104 bpm and left bundle branch block  Relevant CV Studies:none  Laboratory Data:  Chemistry Recent Labs  Lab May 03, 2018 0449  NA 138  K 4.1  CL 101  CO2 25  GLUCOSE 544*  BUN 28*  CREATININE 1.05*  CALCIUM 9.6  GFRNONAA 49*  GFRAA 57*  ANIONGAP 12    No results for input(s): PROT, ALBUMIN, AST, ALT, ALKPHOS,  BILITOT in the last 168 hours. Hematology Recent Labs  Lab May 03, 2018 0449  WBC 17.0*  RBC 4.71  HGB 13.6  HCT 45.2  MCV 96.0  MCH 28.9  MCHC 30.1  RDW 13.1  PLT 204   Cardiac Enzymes Recent Labs  Lab May 03, 2018 0449 May 03, 2018 1117  TROPONINI 0.21* 2.58*   No results for input(s): TROPIPOC in the last 168 hours.  BNP Recent Labs  Lab 06/08/2018 0449  BNP 793.6*    DDimer No results for input(s): DDIMER in the last 168 hours.  Radiology/Studies:  Dg Chest Portable 1 View  Result Date: 2018-06-08 CLINICAL DATA:  Shortness of breath for the past several hours. Cough for the past several days. EXAM: PORTABLE CHEST 1 VIEW COMPARISON:  Jun 08, 2018 FINDINGS: Grossly unchanged cardiac silhouette and mediastinal contours with atherosclerotic plaque within thoracic aorta. Pulmonary vasculature appears less distinct than present examination with cephalization of flow. Worsening bilateral mid and lower lung heterogeneous opacities, right greater than left, potentially accentuated due to overlying soft tissues. No pleural effusion or pneumothorax. No acute osseous abnormalities. Degenerative change of the right glenohumeral joint, incompletely evaluated. IMPRESSION: 1. Right lower lung heterogeneous opacities worrisome for developing pneumonia. A follow-up chest radiograph in 3 to 4 weeks after treatment is recommended to ensure resolution. 2. Suspected mild pulmonary edema. Electronically Signed   By: Simonne Come M.D.   On: 06-08-2018 08:27   Dg Chest Portable 1 View  Result Date: 08-Jun-2018 CLINICAL DATA:  Acute onset of shortness of breath and cough. Decreased O2 saturation. EXAM: PORTABLE CHEST 1 VIEW COMPARISON:  None. FINDINGS: The lungs are well-aerated. Vascular congestion is noted. Increased interstitial markings raise concern for pulmonary edema. There is no evidence of pleural effusion or pneumothorax. The cardiomediastinal silhouette is mildly enlarged. No acute osseous abnormalities  are seen. IMPRESSION: Vascular congestion and mild cardiomegaly. Increased interstitial markings raise concern for pulmonary edema. Electronically Signed   By: Roanna Raider M.D.   On: Jun 08, 2018 05:14    Assessment and Plan:   1. Elevated troponin - Troponin I 0.21>>2.58.  No chest pain. New LBBB but no prior to compare. Start heparin and get echo. Start low dose metoprolol.  Trend troponin.  NPO after MN for possible cath.   2.  Sepsis secondary to pneumonia -Per primary team  3.  Hypertension -Blood pressure stable.  Start beta-blocker as above.  For questions or updates, please contact [facility] Please consult [email or web address] for contact info under     Lorelei Pont, [place]  Jun 08, 2018 3:38 PM

## 2018-06-09 NOTE — ED Triage Notes (Addendum)
Pt presents with sob that started a few hours ago. States she has had a cough over the past 3 days. Pt is sob on arrival. 02 sats 83% on RA. RT at bedside on arrival. MD in room on arrival.

## 2018-06-09 NOTE — Progress Notes (Signed)
CRITICAL VALUE ALERT  Critical Value:  LA 2.7  Date & Time Notied:  10-12-2017@1530   Provider Notified: dr Ophelia Charteryates  Orders Received/Actions taken: waiting on call back

## 2018-06-09 NOTE — Progress Notes (Signed)
Patient's repeat troponin is now >12.    I spoke with pulmonary; Dr. Marchelle Gearingamaswamy will go to see the patient, as she appears likely to need intubation to reduce the demand on her heart.  She is currently in CT having CTA.  This now appears to be a clear NSTEMI, possibly brought on by infection.  Regardless, she is not stable for cardiac catheterization at this time.  She is being medically treated with heparin infusion.  Dr. Rennis GoldenHilty is aware of the change in status and is following closely, as well.  Jonah BlueJennifer Ruvi Fullenwider, M.D.

## 2018-06-09 NOTE — Progress Notes (Signed)
CRITICAL VALUE ALERT  Critical Value:  LA 2.3 and troponin 2.58  Date & Time Notied:  October 01, 2017@1224   Provider Notified: dr Ophelia Charteryates  Orders Received/Actions taken: new orders to give 200cc bolus and will consult cards regarding troponin

## 2018-06-09 NOTE — Consult Note (Signed)
NAME:  Kathleen Lyons, MRN:  [id number], DOB:  [birth year], LOS: 0 ADMISSION DATE:  06-10-2018, CONSULTATION DATE:  06-10-2018 REFERRING MD:  TRH MD, CHIEF COMPLAINT:  Acute Pulmonary Edemea, lactic acidosis, MI  Brief History   PEA and asystolic cardiac arrest  History of present illness   85 year old female called urgently for critical care consultation.  Very little history is obtained because patient had just on a cardiac arrest at the time of my arrival when I responded to the consult call promptly.  It appears she was admitted for pneumonia and had a new onset left bundle branch block with a mildly elevated lactic acidosis and a slight increase in troponin.  Repeat troponin came back at 12.  As again from the hospitalist patient was already on IV heparin drip and evaluated by cardiology.  She was not considered a good candidate for cardiac catheterization.  Patient underwent CT angiogram chest PE protocol which we were told verbally at the time of cardiac arrest that did not show any PE.  Shortly after the CT scan patient underwent full cardiac arrest PEA subsequently degenerated to asystole.  Critical care medicine was on the scene within the first 2 minutes.  PEA and asystole algorithm was carried out with repeated epinephrines, bicarb bolus without any return of spontaneous circulation for the first 20 minutes.  After which she has had intermittent pulse requiring intermittent CPR.  She finally lost pulse at 7:22 PM despite Levophed drip and by this time atropine x1 for bradycardia and death was declared.  During the CPR she was also intubated in the CT scan suite by anesthesiology.  Critical care medicine was involved throughout the code process.  Past Medical History     has a past medical history of Asthma, Coronary artery disease, Diabetes mellitus without complication (HCC), Hypertension, and Stroke (HCC) (03/2013).   reports that she has never smoked. She has never used smokeless  tobacco.  Past Surgical History:  Procedure Laterality Date  . BREAST SURGERY      No Known Allergies   There is no immunization history on file for this patient.  History reviewed. No pertinent family history.   Current Facility-Administered Medications:  .  acetaminophen (TYLENOL) tablet 650 mg, 650 mg, Oral, Q6H PRN **OR** acetaminophen (TYLENOL) suppository 650 mg, 650 mg, Rectal, Q6H PRN, Jonah Blue, MD .  albuterol (PROVENTIL) (2.5 MG/3ML) 0.083% nebulizer solution 2.5 mg, 2.5 mg, Nebulization, Q2H PRN, Jonah Blue, MD, 2.5 mg at 06-10-18 1716 .  aspirin EC tablet 81 mg, 81 mg, Oral, Daily, Jonah Blue, MD .  Melene Muller ON 06/01/2018] azithromycin (ZITHROMAX) 500 mg in sodium chloride 0.9 % 250 mL IVPB, 500 mg, Intravenous, Daily, Jonah Blue, MD .  brimonidine (ALPHAGAN) 0.2 % ophthalmic solution 1 drop, 1 drop, Both Eyes, BID, Jonah Blue, MD .  Melene Muller ON 06/01/2018] cefTRIAXone (ROCEPHIN) 1 g in sodium chloride 0.9 % 100 mL IVPB, 1 g, Intravenous, Q24H, Jonah Blue, MD .  Melene Muller ON 06/01/2018] clopidogrel (PLAVIX) tablet 75 mg, 75 mg, Oral, Q breakfast, Jonah Blue, MD .  docusate sodium (COLACE) capsule 100 mg, 100 mg, Oral, BID, Jonah Blue, MD .  heparin ADULT infusion 100 units/mL (25000 units/266mL sodium chloride 0.45%), 700 Units/hr, Intravenous, Continuous, Mosetta Anis, RPH, Last Rate: 7 mL/hr at June 10, 2018 1721, 700 Units/hr at June 10, 2018 1721 .  insulin aspart (novoLOG) injection 0-5 Units, 0-5 Units, Subcutaneous, QHS, Jonah Blue, MD .  insulin aspart (novoLOG) injection 0-9 Units, 0-9 Units, Subcutaneous, TID WC,  Jonah BlueYates, Jennifer, MD, 3 Units at 26-Jun-2017 1638 .  insulin detemir (LEVEMIR) injection 28 Units, 28 Units, Subcutaneous, QHS, Jonah BlueYates, Jennifer, MD .  iopamidol (ISOVUE-370) 76 % injection, , , ,  .  lactated ringers infusion, , Intravenous, Continuous, Jonah BlueYates, Jennifer, MD, Last Rate: 100 mL/hr at 26-Jun-2017 1144 .   latanoprost (XALATAN) 0.005 % ophthalmic solution 1 drop, 1 drop, Both Eyes, QHS, Jonah BlueYates, Jennifer, MD .  LORazepam (ATIVAN) injection 0.5 mg, 0.5 mg, Intravenous, Q4H PRN, Jonah BlueYates, Jennifer, MD, 0.5 mg at 26-Jun-2017 1726 .  metoprolol tartrate (LOPRESSOR) injection 5 mg, 5 mg, Intravenous, Q6H PRN, Hilty, Lisette AbuKenneth C, MD .  metoprolol tartrate (LOPRESSOR) tablet 12.5 mg, 12.5 mg, Oral, BID, Bhagat, Bhavinkumar, PA .  ondansetron (ZOFRAN) tablet 4 mg, 4 mg, Oral, Q6H PRN **OR** ondansetron (ZOFRAN) injection 4 mg, 4 mg, Intravenous, Q6H PRN, Jonah BlueYates, Jennifer, MD .  polyethylene glycol (MIRALAX / GLYCOLAX) packet 17 g, 17 g, Oral, Daily PRN, Jonah BlueYates, Jennifer, MD .  predniSONE (DELTASONE) tablet 60 mg, 60 mg, Oral, Q breakfast, Jonah BlueYates, Jennifer, MD, 60 mg at 26-Jun-2017 1609 .  [START ON 06/01/2018] rosuvastatin (CRESTOR) tablet 40 mg, 40 mg, Oral, Daily, Jonah BlueYates, Jennifer, MD .  sodium chloride flush (NS) 0.9 % injection 3 mL, 3 mL, Intravenous, Q12H, Jonah BlueYates, Jennifer, MD, 3 mL at 26-Jun-2017 1151   Significant Hospital Events   07-07-2017 -admit 26-Jun-2017 - PCCM consult for resp distress, lactic acidosis and rising troponin  Consults:  12/23 - cards 12/23 - PCCM  Procedures:  x  Significant Diagnostic Tests:  x  Micro Data:  x  Antimicrobials:  x   Interim history/subjective:  12/23 - bipap , resp distress - > cardiac arrest  Objective   Blood pressure (!) 43/28, pulse (!) 141, temperature 98.6 F (37 C), temperature source Oral, resp. rate (!) 0, height 5\' 1"  (1.549 m), weight 64.8 kg, SpO2 (!) 6 %.    FiO2 (%):  [40 %-70 %] 60 %   Intake/Output Summary (Last 24 hours) at 07-07-2017 1935 Last data filed at 07-07-2017 1400 Gross per 24 hour  Intake 762.22 ml  Output -  Net 762.22 ml   Filed Weights   26-Jun-2017 0559 26-Jun-2017 1003  Weight: 67.1 kg 64.8 kg   Elderly female undergoing CPR Intubated Limbs appear intact   LABS    PULMONARY Recent Labs  Lab 26-Jun-2017 0558  PHART  7.374  PCO2ART 49.1*  PO2ART 64.0*  HCO3 28.7*  TCO2 30  O2SAT 91.0    CBC Recent Labs  Lab 26-Jun-2017 0449  HGB 13.6  HCT 45.2  WBC 17.0*  PLT 204    COAGULATION No results for input(s): INR in the last 168 hours.  CARDIAC   Recent Labs  Lab 26-Jun-2017 0449 26-Jun-2017 1117 26-Jun-2017 1716  TROPONINI 0.21* 2.58* 12.77*   No results for input(s): PROBNP in the last 168 hours.   CHEMISTRY Recent Labs  Lab 26-Jun-2017 0449  NA 138  K 4.1  CL 101  CO2 25  GLUCOSE 544*  BUN 28*  CREATININE 1.05*  CALCIUM 9.6   Estimated Creatinine Clearance: 35 mL/min (A) (by C-G formula based on SCr of 1.05 mg/dL (H)).   LIVER No results for input(s): AST, ALT, ALKPHOS, BILITOT, PROT, ALBUMIN, INR in the last 168 hours.   INFECTIOUS Recent Labs  Lab 26-Jun-2017 0759 26-Jun-2017 1117 26-Jun-2017 1353  LATICACIDVEN 2.06* 2.3* 2.7*  PROCALCITON  --  0.34  --      ENDOCRINE CBG (last 3)  Recent  Labs    2018-06-16 1008 06/16/2018 1123 06/16/2018 1631  GLUCAP 296* 231* 215*         IMAGING x48h  - image(s) personally visualized  -   highlighted in bold Ct Angio Chest Pe W Or Wo Contrast  Result Date: Jun 16, 2018 CLINICAL DATA:  Chest pain and shortness of breath. Evaluate for pulmonary embolism. EXAM: CT ANGIOGRAPHY CHEST WITH CONTRAST TECHNIQUE: Multidetector CT imaging of the chest was performed using the standard protocol during bolus administration of intravenous contrast. Multiplanar CT image reconstructions and MIPs were obtained to evaluate the vascular anatomy. CONTRAST:  75mL ISOVUE-370 IOPAMIDOL (ISOVUE-370) INJECTION 76% COMPARISON:  Chest radiographs earlier today. FINDINGS: Cardiovascular: The pulmonary arteries are well opacified with contrast to the level of the subsegmental branches. There is no evidence of acute pulmonary embolism. There is extensive atherosclerosis of the aorta, great vessels and coronary arteries. There is limited opacification of the systemic  arteries, but no apparent acute abnormality. The heart size is normal. There is no pericardial effusion. Mediastinum/Nodes: There are no enlarged mediastinal, hilar or axillary lymph nodes.No evidence of mediastinal hematoma. The thyroid gland, trachea and esophagus demonstrate no significant findings. Lungs/Pleura: There are moderate size dependent pleural effusions bilaterally with associated bibasilar airspace opacities, likely atelectasis. Within the aerated lungs, there are multifocal ground-glass and airspace opacities which have progressed from earlier today. Upper abdomen: No acute findings are demonstrated within the visualized upper abdomen. Musculoskeletal/Chest wall: There is generalized edema throughout the subcutaneous fat suspicious for anasarca. No evidence of chest wall mass or acute osseous abnormality. There are degenerative changes throughout the spine. Review of the MIP images confirms the above findings. IMPRESSION: 1. No evidence of acute pulmonary embolism. 2. Extensive bilateral ground-glass and airspace opacities in both lungs with moderate bilateral pleural effusions, worsening from radiographs done earlier today. Appearance is suspicious for multilobar pneumonia, possibly on the basis of aspiration. Electronically Signed   By: Carey Bullocks M.D.   On: 16-Jun-2018 19:03   Dg Chest Portable 1 View  Result Date: 06/16/2018 CLINICAL DATA:  Shortness of breath for the past several hours. Cough for the past several days. EXAM: PORTABLE CHEST 1 VIEW COMPARISON:  2018-06-16 FINDINGS: Grossly unchanged cardiac silhouette and mediastinal contours with atherosclerotic plaque within thoracic aorta. Pulmonary vasculature appears less distinct than present examination with cephalization of flow. Worsening bilateral mid and lower lung heterogeneous opacities, right greater than left, potentially accentuated due to overlying soft tissues. No pleural effusion or pneumothorax. No acute osseous  abnormalities. Degenerative change of the right glenohumeral joint, incompletely evaluated. IMPRESSION: 1. Right lower lung heterogeneous opacities worrisome for developing pneumonia. A follow-up chest radiograph in 3 to 4 weeks after treatment is recommended to ensure resolution. 2. Suspected mild pulmonary edema. Electronically Signed   By: Simonne Come M.D.   On: 06/16/18 08:27   Dg Chest Portable 1 View  Result Date: 16-Jun-2018 CLINICAL DATA:  Acute onset of shortness of breath and cough. Decreased O2 saturation. EXAM: PORTABLE CHEST 1 VIEW COMPARISON:  None. FINDINGS: The lungs are well-aerated. Vascular congestion is noted. Increased interstitial markings raise concern for pulmonary edema. There is no evidence of pleural effusion or pneumothorax. The cardiomediastinal silhouette is mildly enlarged. No acute osseous abnormalities are seen. IMPRESSION: Vascular congestion and mild cardiomegaly. Increased interstitial markings raise concern for pulmonary edema. Electronically Signed   By: Roanna Raider M.D.   On: 06-16-18 05:14     Resolved Hospital Problem list   x  Assessment & Plan:  X  PEA/asystolic cardiac arrest that was ultimately unsuccessful -etiology being myocardial infarction.   News of death conveyed to the daughter who is extremely emotional.  Lucila MaineGrandson was also updated   [facility] hospitalist primary      ATTESTATION & SIGNATURE   The patient Emi BelfastMary Noell is critically ill with multiple organ systems failure and requires high complexity decision making for assessment and support, frequent evaluation and titration of therapies, application of advanced monitoring technologies and extensive interpretation of multiple databases.   Critical Care Time devoted to patient care services described in this note is  30  Minutes. This time reflects time of care of this signee Dr Kalman ShanMurali Arnetha Silverthorne. This critical care time does not reflect procedure time, or teaching time or  supervisory time of PA/NP/Med student/Med Resident etc but could involve care discussion time     Dr. Kalman ShanMurali Sharicka Pogorzelski, M.D., [facility] Pulmonary and Critical Care Medicine Staff Physician [facility] Pager: [phone], If no answer or between  15:00h - 7:00h: call [phone]  07-24-17 7:35 PM

## 2018-06-09 NOTE — ED Provider Notes (Addendum)
Physical Exam  BP 116/75   Pulse (!) 116   Temp 97.8 F (36.6 C) (Axillary)   Resp (!) 26   Ht 5' (1.524 m)   Wt 67.1 kg   SpO2 100%   BMI 28.90 kg/m   Physical Exam  ED Course/Procedures   Clinical Course as of May 31 700  Mon May 31, 2018  [id number] Patient afebrile but initial lab work with a leukocytosis to 17 and glucose of 544.  No anion gap.  Given these derangements, septic work-up was initiated.  Blood cultures and a liter of fluids was ordered.  She is not hypotensive.  Will obtain lactate and ABG.  She was given broad-spectrum antibiotics with Rocephin and azithromycin.   [CH]  0553 Troponin noted to be 0.21.  No active chest pain.  Patient will be given aspirin.  Suspect that this is likely demand related.  On recheck, she clinically appears better.  She continues to have a neb going.  She now is moving better air with faint wheeze.  ABG and BNP are pending.  Chest x-ray was read as pulmonary edema; however, clinically she has no evidence of volume overload.  No echo in the chart.  She is improving with breathing treatments.  Again clinically more suspicious for URI versus pneumonia versus reactive airway disease.  She will likely need echocardiogram and will need repeat troponins for trending.  EKG is without any evidence of acute ischemia.  She is hyperglycemic to 544.  1 L fluids is going.  Reports compliance with medications.  Reports her blood sugars yesterday were in the 100s.  Troponin I(!!): 0.21 [CH]  [id number] Spoke with Dr. Toniann FailKakrakandy who has accepted the patient to the stepdown unit.  Will initiate glucose stabilizer.  I have reviewed her ABG.   [CH]  [phone] Patient has better aeration but now has become tachypneic again.  Given her work of breathing and hypoxia on ABG, will place on BiPAP.  Text page sent to hospitalist update.   [CH]    Clinical Course User Index [CH] Horton, Mayer Maskerourtney F, MD    Procedures  MDM    Assuming care of patient from Dr. Wilkie AyeHorton.    Patient in the ED for DIB. Workup thus far shows vascular congestion in patient with respiratory distress.  Concerning findings are as following elevated troponin, lactate and WC. Pt was 83% on room air at arrival and very tight. Important pending results are   According to Dr. Wilkie AyeHorton, plan is to follow the patient's response to treatment. She has received antibiotics and is on Bipap.   Patient had no complains, no concerns from the nursing side. Will continue to monitor.  We will continue to follow.   EKG Interpretation  Date/Time:  Monday May 31 2018 05:05:18 EST Ventricular Rate:  104 PR Interval:    QRS Duration: 126 QT Interval:  364 QTC Calculation: 479 R Axis:   -56 Text Interpretation:  Sinus or ectopic atrial tachycardia Left bundle branch block LBBB new when compared to prior Confirmed by Ross MarcusHorton, Courtney ([id number]) on May 30, 2018 5:08:49 AM       Derwood KaplanNanavati, Abriel Hattery, MD 09/20/2017 0703  7:15 am: Patient has poor aeration.  No wheezing appreciated.  Awaiting placement.  Patient feeling better.  8:10 AM: Patient's BP has dropped. Map noted to be 65, however blood pressure is 80/48.  She is noted to be slightly clammy, but otherwise has no complaints.  Lung exam is unchanged. I will order the 500 cc bolus  for now.  PPV that comes with BiPAP might be contributing. Repeat x-ray ordered. Lactic acid has actually improved to 2.06.   Derwood KaplanNanavati, Nikeria Kalman, MD July 17, 2017 0813  8:30 am Repeat x-ray shows developing pneumonia in the right lower lobe. Patient has now received 1 L of fluid.  Her blood pressure immediately spiked up to 90s systolic. Transfer team is at the bedside now. Patient and family made aware, that if the blood pressure drops persistently she might need more fluid.  They are also made aware of a possibility for intubation if her status worsens.  9:00am: I have paged the admitting staff, Dr. Sharon SellerMcclung to update him on the recent developments and to ensure patient  gets close monitoring and further resuscitation if required. Flu is pending.  CRITICAL CARE Performed by: Jushua Waltman   Total critical care time: 32 minutes  Critical care time was exclusive of separately billable procedures and treating other patients.  Critical care was necessary to treat or prevent imminent or life-threatening deterioration.  Critical care was time spent personally by me on the following activities: development of treatment plan with patient and/or surrogate as well as nursing, discussions with consultants, evaluation of patient's response to treatment, examination of patient, obtaining history from patient or surrogate, ordering and performing treatments and interventions, ordering and review of laboratory studies, ordering and review of radiographic studies, pulse oximetry and re-evaluation of patient's condition.     Derwood KaplanNanavati, Clide Remmers, MD July 17, 2017 [phone]

## 2018-06-09 NOTE — Death Summary Note (Signed)
Death Summary  Kathleen BelfastMary Lavergne ZOX:[id number] DOB: 06-Apr-1933 DOA: May 07, 2018  PCP: System, Pcp Not In  Admit date: May 07, 2018 Date of Death: May 07, 2018  Final Diagnoses:  Principal Problem:   Acute respiratory failure with hypoxia (HCC) Active Problems:   Sepsis due to pneumonia [facility](HCC)   Essential hypertension   Elevated troponin   Uncontrolled diabetes mellitus type 2 without complications (HCC)    History of present illness:  Kathleen Lyons is a 85 y.o. female with medical history significant of CVA; CAD; HTN; and DM [facility]B.  She has been complaining of a cold.  She was trying to use OTC cough syrups but her daughter didn't want to give them.  Overnight, she felt like she was having an asthma attack.  She seemed to be breathing real hard with abdominal breathing.  She felt too weak to move.  She has been having cold symptoms for about a week - but has chronic rhinorrhea.  Her daughter has not noticed her coughing, but the patient has been complaining of being up all night coughing.  + sick contacts.  The patient's grandson also used really strong ingredients in the vaporizer and it was burning their eyes, they thought maybe it was too strong.  No fever, "she's been normal."  Cough is nonproductive.  She is feeling more comfortable on the BIPAP.  She is diabetic and loves sweets.  Hospital Course:  [facility]  Working diagnosis on admission was BIPAP-dependent respiratory failure resulting from sepsis due to pneumonia.  She had mild troponin elevation to 0.21 at the time of admission.  Her troponin increased to 2.58 and her lactic acid remained elevated.  She was discussed with Dr. Marchelle Gearingamaswamy, and cardiology was consulted.  Dr. Rennis GoldenHilty saw the patient and started heparin for NSTEMI, with further crease to 12.77 at 1716.  She was taken down for CTA to assess for PE and had cardiac arrest while in CT about 1820.  She eventually briefly  regained a pulse but expired at 7:22 after many rounds of CPR and medications.  Family was present and updated throughout and notified by me personally and accompanied by Dr. Marchelle Gearingamaswamy.  I spent an additional 90 minutes in critical care time caring for the patient throughout the day today.  Signed:  Jonah BlueJennifer Penni Penado  [facility] May 07, 2018, 7:52 PM

## 2018-06-09 DEATH — deceased

## 2018-07-29 ENCOUNTER — Encounter: Payer: Self-pay | Admitting: Family Medicine

## 2019-05-02 IMAGING — CT CT ANGIO CHEST
2 of 9 series · 18 of 46 positions shown · IV contrast (iopamidol)
Comparison: Chest radiographs earlier today.

CLINICAL DATA: Chest pain and shortness of breath. Evaluate for
pulmonary embolism.

EXAM:
CT ANGIOGRAPHY CHEST WITH CONTRAST
TECHNIQUE: Multidetector CT imaging of the chest was performed using the
standard protocol during bolus administration of intravenous
contrast. Multiplanar CT image reconstructions and MIPs were
obtained to evaluate the vascular anatomy.
CONTRAST:  75mL KLLF7N-1MB IOPAMIDOL (KLLF7N-1MB) INJECTION 76%

[Series 7: thins · axial · 0.82mm/px · z∈[+1216,+1462]mm · 15 of 272 slices shown]
[im 13/272  lung]
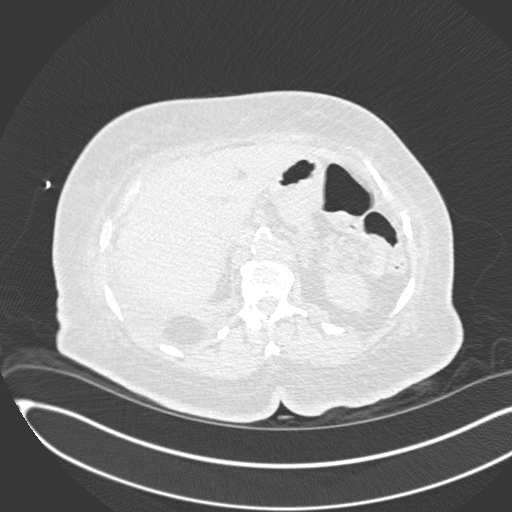
[im 37/272  soft-tissue]
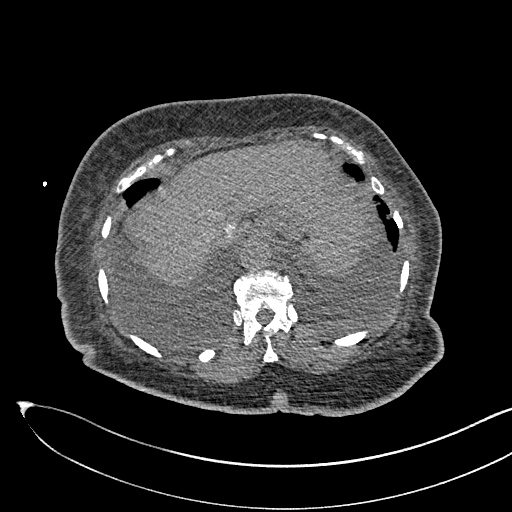
[im 50/272  lung]
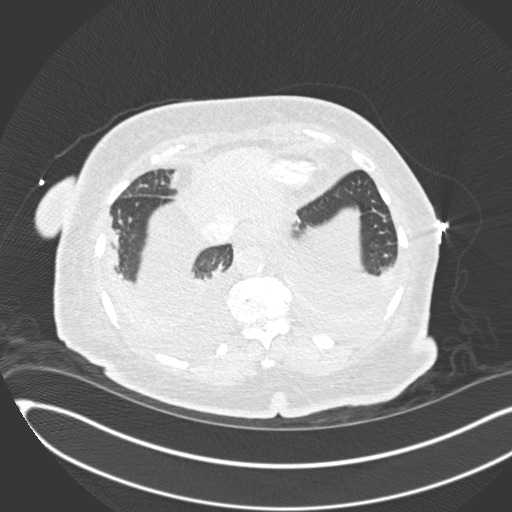
[im 62/272  soft-tissue]
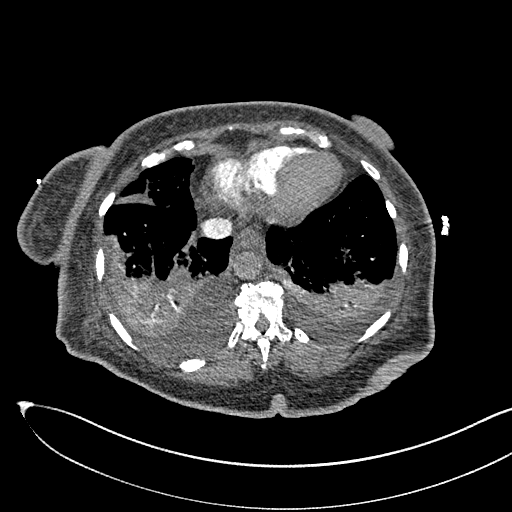
[im 87/272  lung]
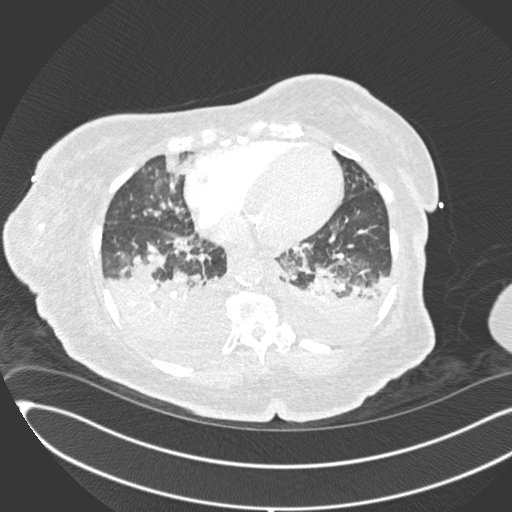
[im 99/272  soft-tissue]
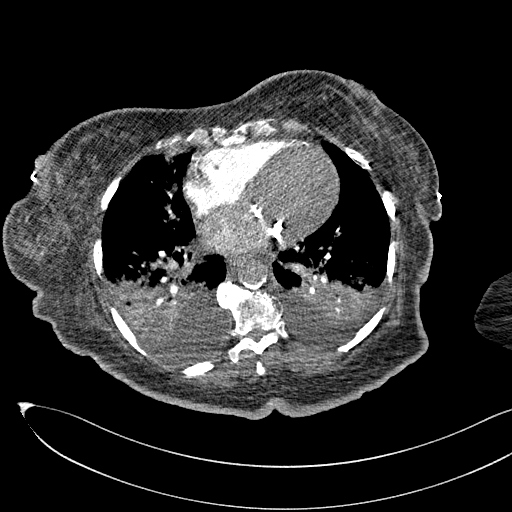
[im 124/272  lung]
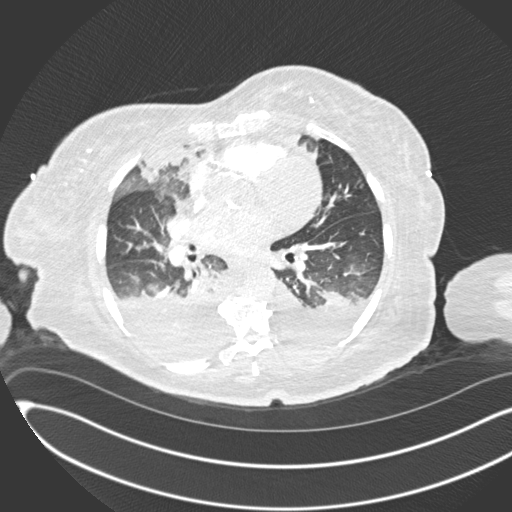
[im 136/272  soft-tissue]
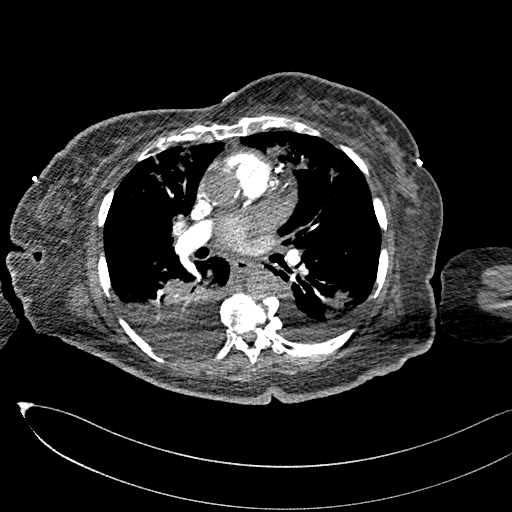
[im 148/272  lung]
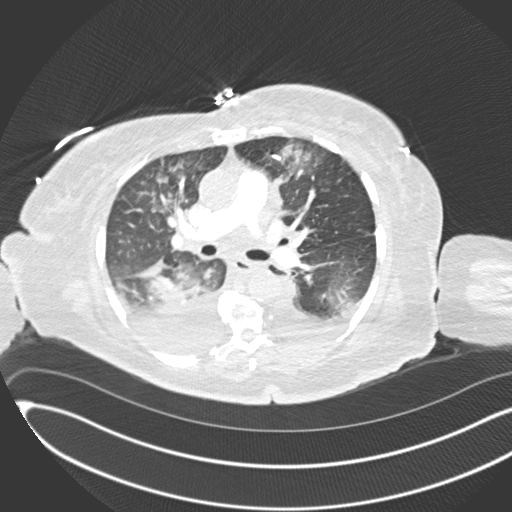
[im 173/272  soft-tissue]
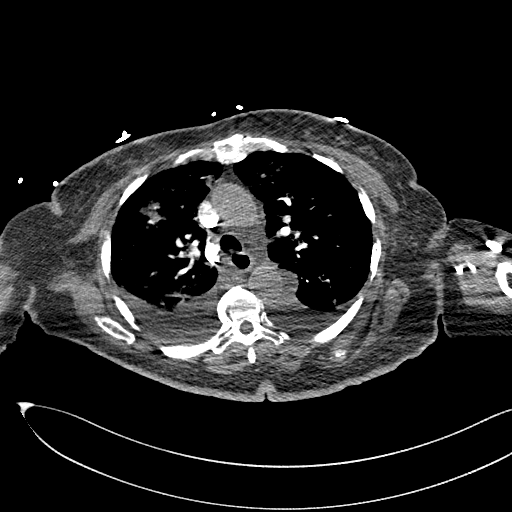
[im 185/272  lung]
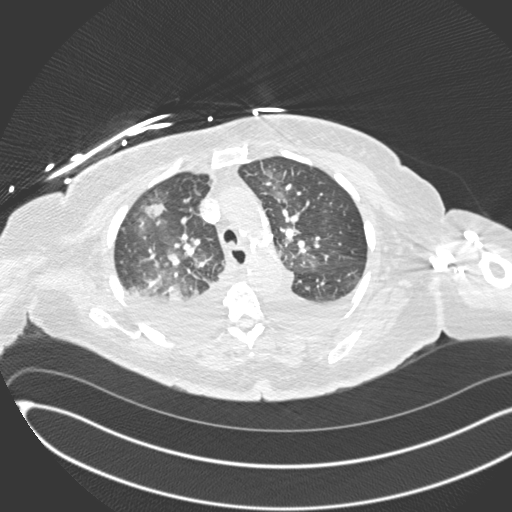
[im 210/272  soft-tissue]
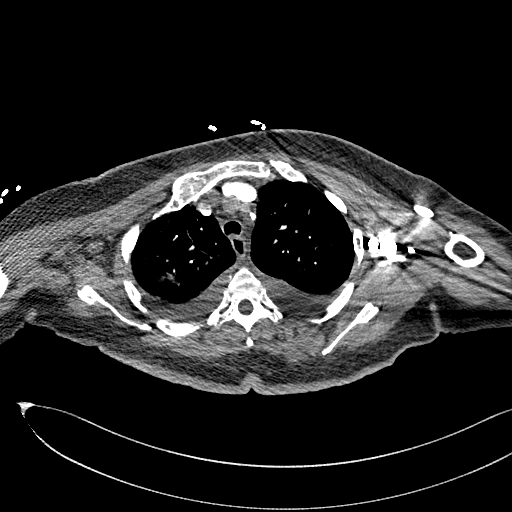
[im 222/272  lung]
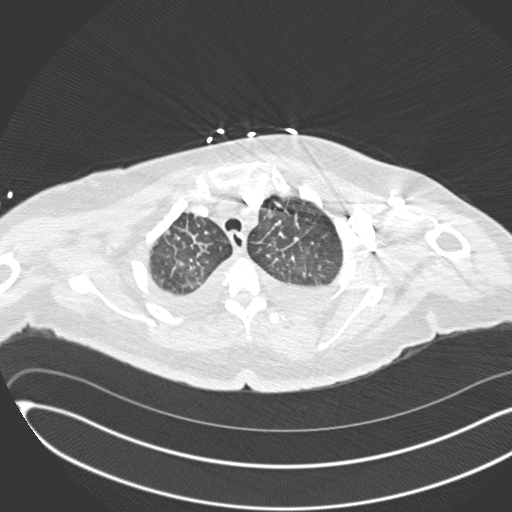
[im 235/272  soft-tissue]
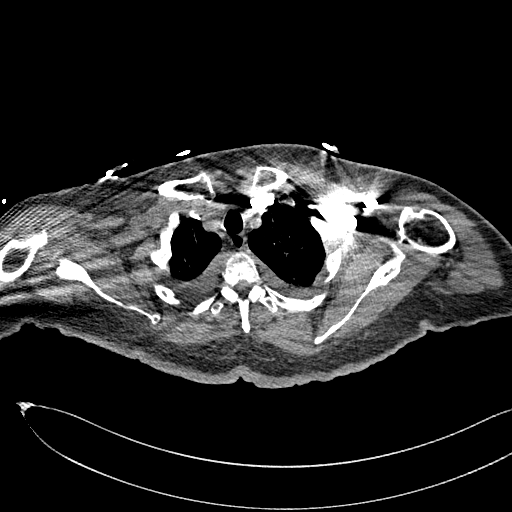
[im 259/272  lung]
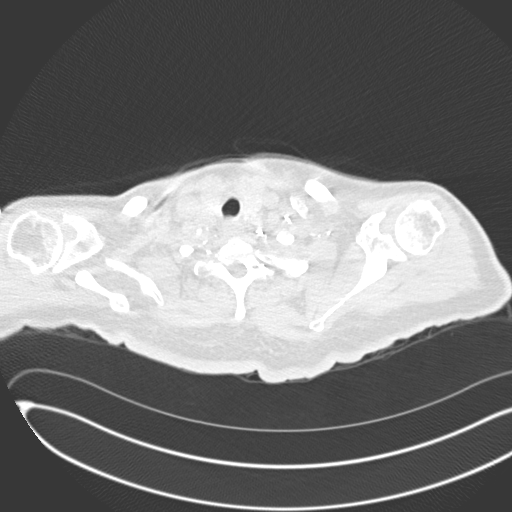

[Series 9: coronal mpr · coronal · 0.59mm/px · 3 of 151 slices shown]
[im 38/151  soft-tissue]
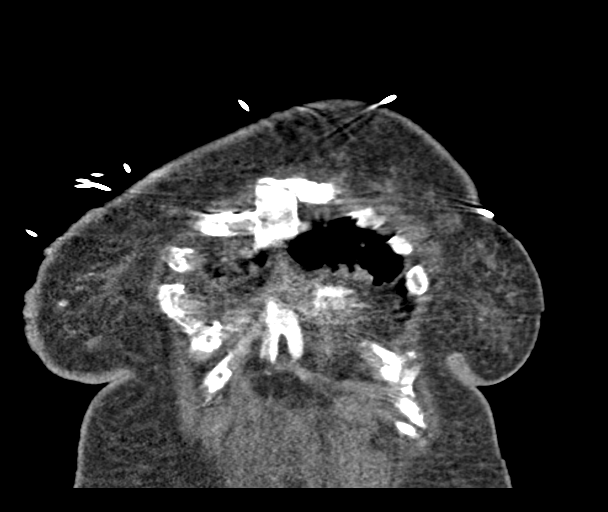
[im 76/151  soft-tissue]
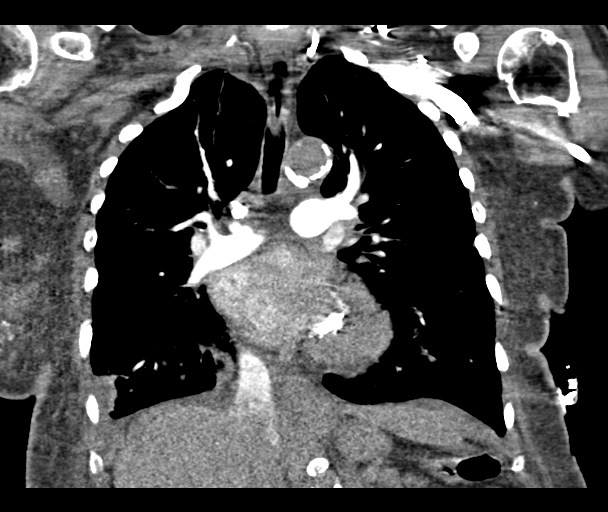
[im 113/151  soft-tissue]
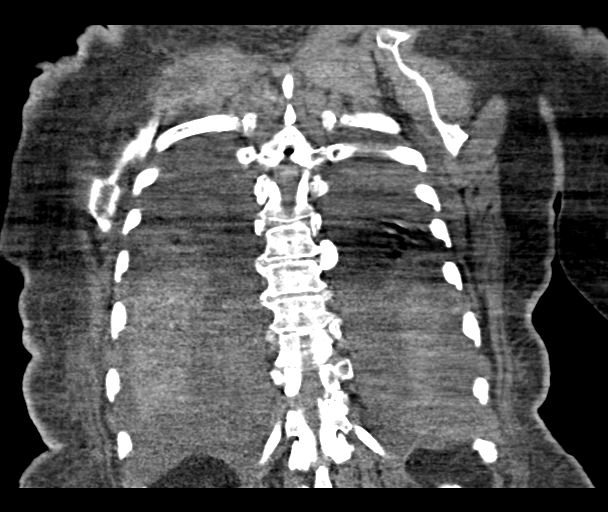

[18 of 46 positions shown; findings below may reference images not displayed]

FINDINGS: Cardiovascular: The pulmonary arteries are well opacified with
contrast to the level of the subsegmental branches. There is no
evidence of acute pulmonary embolism. There is extensive
atherosclerosis of the aorta, great vessels and coronary arteries.
There is limited opacification of the systemic arteries, but no
apparent acute abnormality. The heart size is normal. There is no
pericardial effusion.

Mediastinum/Nodes: There are no enlarged mediastinal, hilar or
axillary lymph nodes.No evidence of mediastinal hematoma. The
thyroid gland, trachea and esophagus demonstrate no significant
findings.

Lungs/Pleura: There are moderate size dependent pleural effusions
bilaterally with associated bibasilar airspace opacities, likely
atelectasis. Within the aerated lungs, there are multifocal
ground-glass and airspace opacities which have progressed from
earlier today.

Upper abdomen: No acute findings are demonstrated within the
visualized upper abdomen.

Musculoskeletal/Chest wall: There is generalized edema throughout
the subcutaneous fat suspicious for anasarca. No evidence of chest
wall mass or acute osseous abnormality. There are degenerative
changes throughout the spine.

Review of the MIP images confirms the above findings.
IMPRESSION: 1. No evidence of acute pulmonary embolism.
2. Extensive bilateral ground-glass and airspace opacities in both
lungs with moderate bilateral pleural effusions, worsening from
radiographs done earlier today. Appearance is suspicious for
multilobar pneumonia, possibly on the basis of aspiration.

## 2019-05-02 IMAGING — DX DG CHEST 1V PORT
1 series · 1 of 1 positions shown · non-contrast
Comparison: 05/31/2018

CLINICAL DATA: Shortness of breath for the past several hours.
Cough for the past several days.

EXAM:
PORTABLE CHEST 1 VIEW

[chest ap]
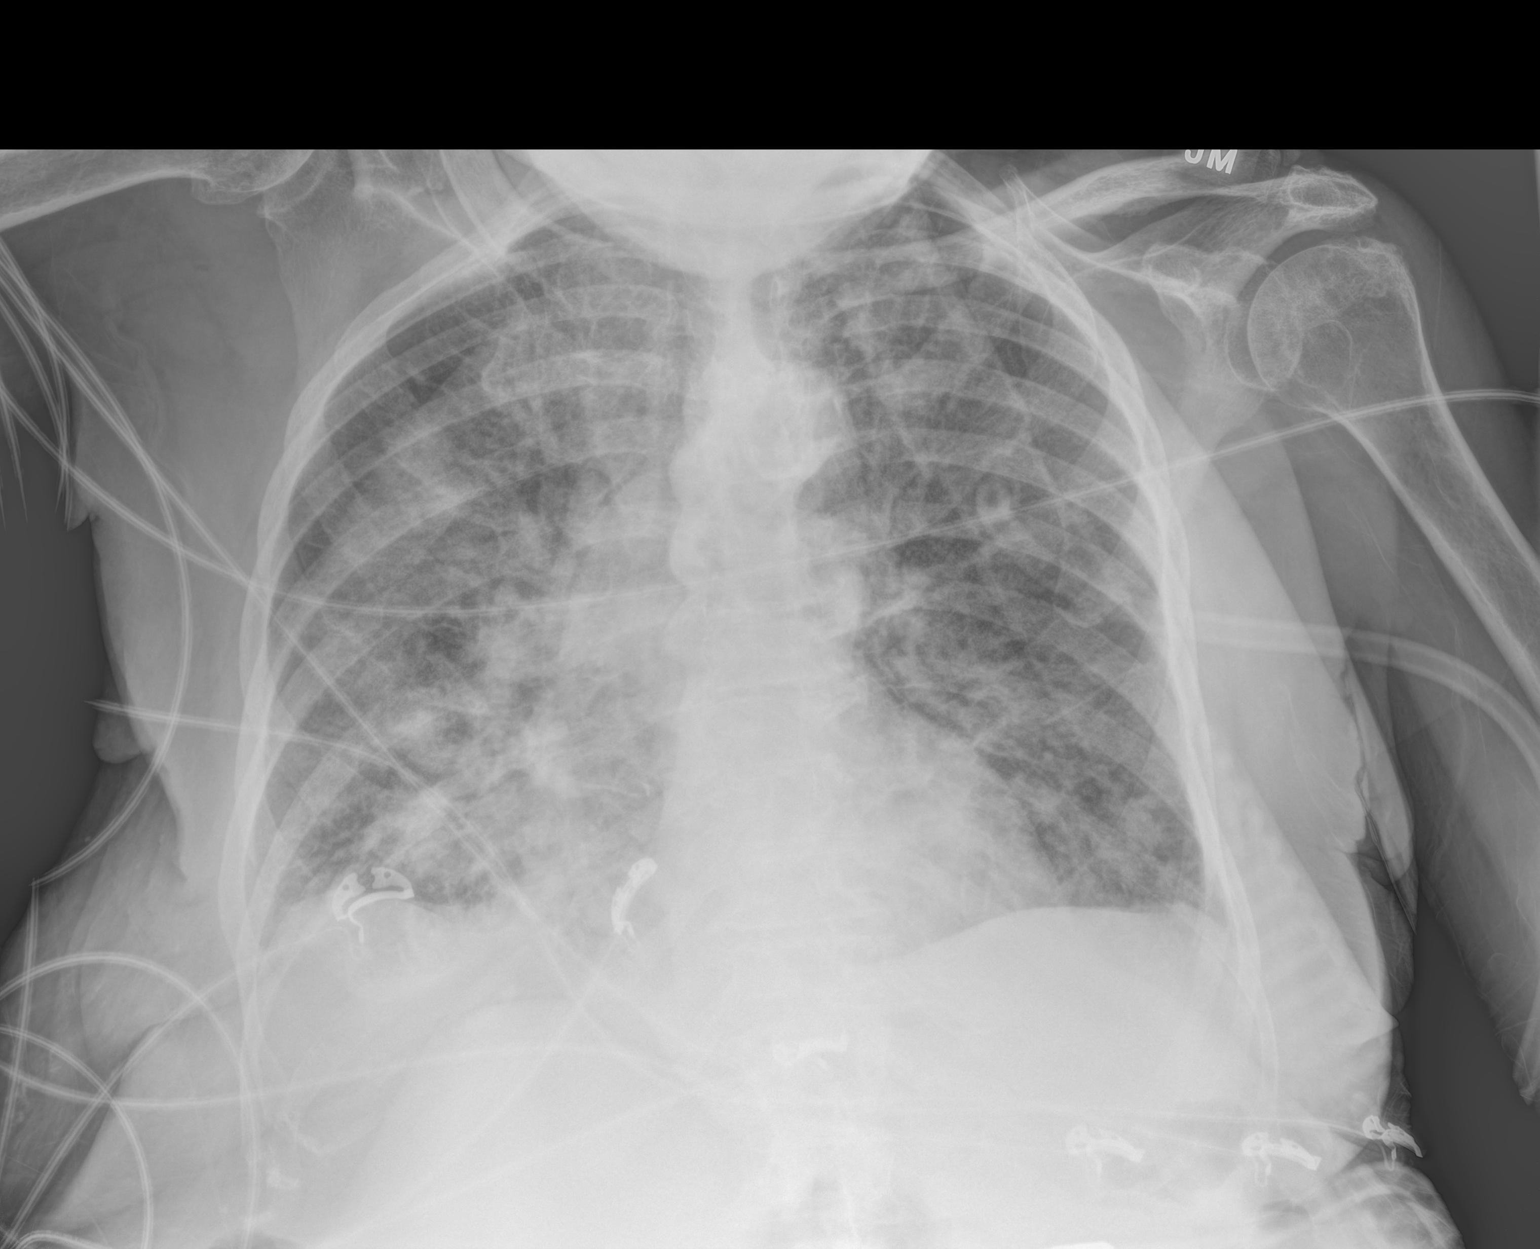

[1 of 1 positions shown; findings below may reference images not displayed]

FINDINGS: Grossly unchanged cardiac silhouette and mediastinal contours with
atherosclerotic plaque within thoracic aorta. Pulmonary vasculature
appears less distinct than present examination with cephalization of
flow. Worsening bilateral mid and lower lung heterogeneous
opacities, right greater than left, potentially accentuated due to
overlying soft tissues. No pleural effusion or pneumothorax. No
acute osseous abnormalities. Degenerative change of the right
glenohumeral joint, incompletely evaluated.
IMPRESSION: 1. Right lower lung heterogeneous opacities worrisome for developing
pneumonia. A follow-up chest radiograph in 3 to 4 weeks after
treatment is recommended to ensure resolution.
2. Suspected mild pulmonary edema.
